# Patient Record
Sex: Female | Born: 1940 | Race: White | Hispanic: No | Smoking: Never smoker
Health system: Southern US, Community
[De-identification: ages and names within clinical notes are randomized; demographics above are authoritative.]

## PROBLEM LIST (undated history)

## (undated) DIAGNOSIS — K219 Gastro-esophageal reflux disease without esophagitis: Secondary | ICD-10-CM

## (undated) DIAGNOSIS — I10 Essential (primary) hypertension: Secondary | ICD-10-CM

## (undated) DIAGNOSIS — R600 Localized edema: Secondary | ICD-10-CM

## (undated) DIAGNOSIS — E785 Hyperlipidemia, unspecified: Secondary | ICD-10-CM

## (undated) DIAGNOSIS — I739 Peripheral vascular disease, unspecified: Secondary | ICD-10-CM

## (undated) DIAGNOSIS — M1990 Unspecified osteoarthritis, unspecified site: Secondary | ICD-10-CM

## (undated) DIAGNOSIS — K5790 Diverticulosis of intestine, part unspecified, without perforation or abscess without bleeding: Secondary | ICD-10-CM

## (undated) DIAGNOSIS — E119 Type 2 diabetes mellitus without complications: Secondary | ICD-10-CM

## (undated) DIAGNOSIS — R011 Cardiac murmur, unspecified: Secondary | ICD-10-CM

## (undated) HISTORY — PX: DILATION AND CURETTAGE OF UTERUS: SHX78

## (undated) HISTORY — DX: Gastro-esophageal reflux disease without esophagitis: K21.9

## (undated) HISTORY — PX: COLON SURGERY: SHX602

## (undated) HISTORY — DX: Unspecified osteoarthritis, unspecified site: M19.90

## (undated) HISTORY — DX: Cardiac murmur, unspecified: R01.1

## (undated) HISTORY — PX: BREAST BIOPSY: SHX20

## (undated) HISTORY — PX: TONSILLECTOMY: SUR1361

---

## 2002-12-18 ENCOUNTER — Other Ambulatory Visit: Payer: Self-pay

## 2004-07-14 ENCOUNTER — Ambulatory Visit: Payer: Self-pay | Admitting: Family Medicine

## 2005-04-22 ENCOUNTER — Ambulatory Visit: Payer: Self-pay

## 2005-04-27 ENCOUNTER — Ambulatory Visit: Payer: Self-pay

## 2005-05-24 ENCOUNTER — Emergency Department: Payer: Self-pay | Admitting: Emergency Medicine

## 2005-10-05 ENCOUNTER — Ambulatory Visit: Payer: Self-pay

## 2006-01-26 HISTORY — PX: CHOLECYSTECTOMY: SHX55

## 2006-03-25 ENCOUNTER — Ambulatory Visit: Payer: Self-pay | Admitting: Internal Medicine

## 2006-03-26 ENCOUNTER — Ambulatory Visit: Payer: Self-pay | Admitting: Internal Medicine

## 2006-04-01 ENCOUNTER — Ambulatory Visit: Payer: Self-pay | Admitting: General Surgery

## 2006-04-01 ENCOUNTER — Other Ambulatory Visit: Payer: Self-pay

## 2006-04-08 ENCOUNTER — Ambulatory Visit: Payer: Self-pay | Admitting: General Surgery

## 2006-04-14 ENCOUNTER — Ambulatory Visit: Payer: Self-pay

## 2006-05-04 ENCOUNTER — Ambulatory Visit: Payer: Self-pay | Admitting: Unknown Physician Specialty

## 2007-06-07 ENCOUNTER — Ambulatory Visit: Payer: Self-pay | Admitting: Unknown Physician Specialty

## 2007-12-09 ENCOUNTER — Ambulatory Visit: Payer: Self-pay | Admitting: Internal Medicine

## 2008-03-19 ENCOUNTER — Ambulatory Visit: Payer: Self-pay | Admitting: Internal Medicine

## 2008-05-31 ENCOUNTER — Ambulatory Visit: Payer: Self-pay | Admitting: Gastroenterology

## 2008-06-14 ENCOUNTER — Ambulatory Visit: Payer: Self-pay | Admitting: Internal Medicine

## 2008-07-26 HISTORY — PX: COLONOSCOPY W/ POLYPECTOMY: SHX1380

## 2008-08-09 ENCOUNTER — Ambulatory Visit: Payer: Self-pay | Admitting: Surgery

## 2008-08-16 ENCOUNTER — Ambulatory Visit: Payer: Self-pay | Admitting: Surgery

## 2009-06-14 ENCOUNTER — Ambulatory Visit: Payer: Self-pay | Admitting: Gastroenterology

## 2009-07-19 ENCOUNTER — Ambulatory Visit: Payer: Self-pay | Admitting: Internal Medicine

## 2010-01-26 HISTORY — PX: EYE SURGERY: SHX253

## 2010-07-11 ENCOUNTER — Ambulatory Visit: Payer: Self-pay | Admitting: Obstetrics and Gynecology

## 2010-08-04 ENCOUNTER — Ambulatory Visit: Payer: Self-pay | Admitting: Obstetrics and Gynecology

## 2010-08-07 ENCOUNTER — Ambulatory Visit: Payer: Self-pay | Admitting: Obstetrics and Gynecology

## 2010-08-12 LAB — PATHOLOGY REPORT

## 2010-08-19 ENCOUNTER — Ambulatory Visit: Payer: Self-pay | Admitting: Internal Medicine

## 2011-03-27 ENCOUNTER — Ambulatory Visit: Payer: Self-pay | Admitting: Physical Medicine and Rehabilitation

## 2011-08-21 ENCOUNTER — Ambulatory Visit: Payer: Self-pay | Admitting: Internal Medicine

## 2011-09-04 ENCOUNTER — Ambulatory Visit: Payer: Self-pay | Admitting: Gastroenterology

## 2012-08-23 ENCOUNTER — Ambulatory Visit: Payer: Self-pay | Admitting: Internal Medicine

## 2012-08-24 ENCOUNTER — Ambulatory Visit: Payer: Self-pay | Admitting: Internal Medicine

## 2012-12-27 ENCOUNTER — Ambulatory Visit: Payer: Self-pay | Admitting: Internal Medicine

## 2013-03-02 ENCOUNTER — Ambulatory Visit: Payer: Self-pay | Admitting: Internal Medicine

## 2013-07-08 DIAGNOSIS — K219 Gastro-esophageal reflux disease without esophagitis: Secondary | ICD-10-CM | POA: Insufficient documentation

## 2013-07-08 DIAGNOSIS — M159 Polyosteoarthritis, unspecified: Secondary | ICD-10-CM | POA: Insufficient documentation

## 2013-07-08 DIAGNOSIS — E1149 Type 2 diabetes mellitus with other diabetic neurological complication: Secondary | ICD-10-CM | POA: Insufficient documentation

## 2013-07-08 DIAGNOSIS — E785 Hyperlipidemia, unspecified: Secondary | ICD-10-CM | POA: Insufficient documentation

## 2013-07-08 DIAGNOSIS — K635 Polyp of colon: Secondary | ICD-10-CM | POA: Insufficient documentation

## 2013-07-08 DIAGNOSIS — I10 Essential (primary) hypertension: Secondary | ICD-10-CM | POA: Insufficient documentation

## 2013-08-21 ENCOUNTER — Ambulatory Visit: Payer: Self-pay | Admitting: Internal Medicine

## 2014-02-12 DIAGNOSIS — E1165 Type 2 diabetes mellitus with hyperglycemia: Secondary | ICD-10-CM | POA: Diagnosis not present

## 2014-03-23 DIAGNOSIS — E1149 Type 2 diabetes mellitus with other diabetic neurological complication: Secondary | ICD-10-CM | POA: Diagnosis not present

## 2014-03-23 DIAGNOSIS — E780 Pure hypercholesterolemia: Secondary | ICD-10-CM | POA: Diagnosis not present

## 2014-03-30 DIAGNOSIS — R1030 Lower abdominal pain, unspecified: Secondary | ICD-10-CM | POA: Diagnosis not present

## 2014-03-30 DIAGNOSIS — E1149 Type 2 diabetes mellitus with other diabetic neurological complication: Secondary | ICD-10-CM | POA: Diagnosis not present

## 2014-03-30 DIAGNOSIS — E780 Pure hypercholesterolemia: Secondary | ICD-10-CM | POA: Diagnosis not present

## 2014-03-30 DIAGNOSIS — I10 Essential (primary) hypertension: Secondary | ICD-10-CM | POA: Diagnosis not present

## 2014-03-30 DIAGNOSIS — R1031 Right lower quadrant pain: Secondary | ICD-10-CM | POA: Diagnosis not present

## 2014-04-19 DIAGNOSIS — M7061 Trochanteric bursitis, right hip: Secondary | ICD-10-CM | POA: Diagnosis not present

## 2014-04-19 DIAGNOSIS — M4806 Spinal stenosis, lumbar region: Secondary | ICD-10-CM | POA: Diagnosis not present

## 2014-04-19 DIAGNOSIS — M5416 Radiculopathy, lumbar region: Secondary | ICD-10-CM | POA: Diagnosis not present

## 2014-04-19 DIAGNOSIS — M7062 Trochanteric bursitis, left hip: Secondary | ICD-10-CM | POA: Diagnosis not present

## 2014-04-19 DIAGNOSIS — M7060 Trochanteric bursitis, unspecified hip: Secondary | ICD-10-CM | POA: Insufficient documentation

## 2014-04-19 DIAGNOSIS — M48061 Spinal stenosis, lumbar region without neurogenic claudication: Secondary | ICD-10-CM | POA: Insufficient documentation

## 2014-05-14 DIAGNOSIS — E1165 Type 2 diabetes mellitus with hyperglycemia: Secondary | ICD-10-CM | POA: Diagnosis not present

## 2014-05-21 DIAGNOSIS — M7062 Trochanteric bursitis, left hip: Secondary | ICD-10-CM | POA: Diagnosis not present

## 2014-05-21 DIAGNOSIS — M5416 Radiculopathy, lumbar region: Secondary | ICD-10-CM | POA: Diagnosis not present

## 2014-05-21 DIAGNOSIS — M4806 Spinal stenosis, lumbar region: Secondary | ICD-10-CM | POA: Diagnosis not present

## 2014-05-21 DIAGNOSIS — M7061 Trochanteric bursitis, right hip: Secondary | ICD-10-CM | POA: Diagnosis not present

## 2014-06-06 DIAGNOSIS — M5136 Other intervertebral disc degeneration, lumbar region: Secondary | ICD-10-CM | POA: Diagnosis not present

## 2014-06-06 DIAGNOSIS — M5416 Radiculopathy, lumbar region: Secondary | ICD-10-CM | POA: Diagnosis not present

## 2014-07-04 DIAGNOSIS — M4806 Spinal stenosis, lumbar region: Secondary | ICD-10-CM | POA: Diagnosis not present

## 2014-07-04 DIAGNOSIS — M5416 Radiculopathy, lumbar region: Secondary | ICD-10-CM | POA: Diagnosis not present

## 2014-07-26 DIAGNOSIS — E1149 Type 2 diabetes mellitus with other diabetic neurological complication: Secondary | ICD-10-CM | POA: Diagnosis not present

## 2014-07-26 DIAGNOSIS — I10 Essential (primary) hypertension: Secondary | ICD-10-CM | POA: Diagnosis not present

## 2014-07-26 DIAGNOSIS — E780 Pure hypercholesterolemia: Secondary | ICD-10-CM | POA: Diagnosis not present

## 2014-08-03 DIAGNOSIS — I10 Essential (primary) hypertension: Secondary | ICD-10-CM | POA: Diagnosis not present

## 2014-08-03 DIAGNOSIS — E780 Pure hypercholesterolemia: Secondary | ICD-10-CM | POA: Diagnosis not present

## 2014-08-03 DIAGNOSIS — E1149 Type 2 diabetes mellitus with other diabetic neurological complication: Secondary | ICD-10-CM | POA: Diagnosis not present

## 2014-08-09 DIAGNOSIS — M5416 Radiculopathy, lumbar region: Secondary | ICD-10-CM | POA: Diagnosis not present

## 2014-08-09 DIAGNOSIS — M5136 Other intervertebral disc degeneration, lumbar region: Secondary | ICD-10-CM | POA: Diagnosis not present

## 2014-08-13 ENCOUNTER — Other Ambulatory Visit: Payer: Self-pay | Admitting: Physical Medicine and Rehabilitation

## 2014-08-13 DIAGNOSIS — M79605 Pain in left leg: Secondary | ICD-10-CM

## 2014-08-13 DIAGNOSIS — M545 Low back pain: Secondary | ICD-10-CM

## 2014-08-13 DIAGNOSIS — M79604 Pain in right leg: Secondary | ICD-10-CM

## 2014-08-24 ENCOUNTER — Ambulatory Visit
Admission: RE | Admit: 2014-08-24 | Discharge: 2014-08-24 | Disposition: A | Discharge status: Home / Self Care | Payer: Commercial Managed Care - HMO | Source: Ambulatory Visit | Attending: Physical Medicine and Rehabilitation | Admitting: Physical Medicine and Rehabilitation

## 2014-08-24 DIAGNOSIS — M4317 Spondylolisthesis, lumbosacral region: Secondary | ICD-10-CM | POA: Diagnosis not present

## 2014-08-24 DIAGNOSIS — M545 Low back pain: Secondary | ICD-10-CM

## 2014-08-24 DIAGNOSIS — M47817 Spondylosis without myelopathy or radiculopathy, lumbosacral region: Secondary | ICD-10-CM | POA: Diagnosis not present

## 2014-08-24 DIAGNOSIS — M5126 Other intervertebral disc displacement, lumbar region: Secondary | ICD-10-CM | POA: Diagnosis not present

## 2014-08-24 DIAGNOSIS — M4806 Spinal stenosis, lumbar region: Secondary | ICD-10-CM | POA: Diagnosis not present

## 2014-08-24 DIAGNOSIS — M79604 Pain in right leg: Secondary | ICD-10-CM

## 2014-08-24 DIAGNOSIS — M79605 Pain in left leg: Secondary | ICD-10-CM

## 2014-09-10 DIAGNOSIS — E1165 Type 2 diabetes mellitus with hyperglycemia: Secondary | ICD-10-CM | POA: Diagnosis not present

## 2014-10-11 DIAGNOSIS — E13319 Other specified diabetes mellitus with unspecified diabetic retinopathy without macular edema: Secondary | ICD-10-CM | POA: Diagnosis not present

## 2014-10-18 DIAGNOSIS — M5136 Other intervertebral disc degeneration, lumbar region: Secondary | ICD-10-CM | POA: Diagnosis not present

## 2014-10-18 DIAGNOSIS — M5416 Radiculopathy, lumbar region: Secondary | ICD-10-CM | POA: Diagnosis not present

## 2014-10-18 DIAGNOSIS — M7541 Impingement syndrome of right shoulder: Secondary | ICD-10-CM | POA: Diagnosis not present

## 2014-10-22 ENCOUNTER — Other Ambulatory Visit: Payer: Self-pay | Admitting: Internal Medicine

## 2014-10-22 DIAGNOSIS — Z1231 Encounter for screening mammogram for malignant neoplasm of breast: Secondary | ICD-10-CM

## 2014-10-25 ENCOUNTER — Other Ambulatory Visit: Payer: Self-pay | Admitting: Internal Medicine

## 2014-10-25 ENCOUNTER — Ambulatory Visit
Admission: RE | Admit: 2014-10-25 | Discharge: 2014-10-25 | Disposition: A | Discharge status: Home / Self Care | Payer: Commercial Managed Care - HMO | Source: Ambulatory Visit | Attending: Internal Medicine | Admitting: Internal Medicine

## 2014-10-25 DIAGNOSIS — Z1231 Encounter for screening mammogram for malignant neoplasm of breast: Secondary | ICD-10-CM

## 2014-11-13 DIAGNOSIS — Z86010 Personal history of colonic polyps: Secondary | ICD-10-CM | POA: Diagnosis not present

## 2014-12-07 DIAGNOSIS — E1149 Type 2 diabetes mellitus with other diabetic neurological complication: Secondary | ICD-10-CM | POA: Diagnosis not present

## 2014-12-07 DIAGNOSIS — E7800 Pure hypercholesterolemia, unspecified: Secondary | ICD-10-CM | POA: Diagnosis not present

## 2014-12-14 DIAGNOSIS — E1140 Type 2 diabetes mellitus with diabetic neuropathy, unspecified: Secondary | ICD-10-CM | POA: Diagnosis not present

## 2014-12-14 DIAGNOSIS — Z23 Encounter for immunization: Secondary | ICD-10-CM | POA: Diagnosis not present

## 2014-12-14 DIAGNOSIS — Z0000 Encounter for general adult medical examination without abnormal findings: Secondary | ICD-10-CM | POA: Diagnosis not present

## 2014-12-14 DIAGNOSIS — I10 Essential (primary) hypertension: Secondary | ICD-10-CM | POA: Diagnosis not present

## 2014-12-14 DIAGNOSIS — E7800 Pure hypercholesterolemia, unspecified: Secondary | ICD-10-CM | POA: Diagnosis not present

## 2014-12-24 ENCOUNTER — Encounter: Payer: Self-pay | Admitting: *Deleted

## 2014-12-25 ENCOUNTER — Ambulatory Visit
Admission: RE | Admit: 2014-12-25 | Discharge: 2014-12-25 | Disposition: A | Discharge status: Home / Self Care | Payer: Commercial Managed Care - HMO | Source: Ambulatory Visit | Attending: Gastroenterology | Admitting: Gastroenterology

## 2014-12-25 ENCOUNTER — Ambulatory Visit: Payer: Commercial Managed Care - HMO | Admitting: Anesthesiology

## 2014-12-25 ENCOUNTER — Encounter
Admission: RE | Disposition: A | Discharge status: Home / Self Care | Payer: Self-pay | Source: Ambulatory Visit | Attending: Gastroenterology

## 2014-12-25 ENCOUNTER — Encounter: Payer: Self-pay | Admitting: *Deleted

## 2014-12-25 DIAGNOSIS — Z7984 Long term (current) use of oral hypoglycemic drugs: Secondary | ICD-10-CM | POA: Diagnosis not present

## 2014-12-25 DIAGNOSIS — Z79899 Other long term (current) drug therapy: Secondary | ICD-10-CM | POA: Insufficient documentation

## 2014-12-25 DIAGNOSIS — K635 Polyp of colon: Secondary | ICD-10-CM | POA: Diagnosis not present

## 2014-12-25 DIAGNOSIS — K5790 Diverticulosis of intestine, part unspecified, without perforation or abscess without bleeding: Secondary | ICD-10-CM | POA: Diagnosis not present

## 2014-12-25 DIAGNOSIS — K621 Rectal polyp: Secondary | ICD-10-CM | POA: Insufficient documentation

## 2014-12-25 DIAGNOSIS — K5730 Diverticulosis of large intestine without perforation or abscess without bleeding: Secondary | ICD-10-CM | POA: Diagnosis not present

## 2014-12-25 DIAGNOSIS — E785 Hyperlipidemia, unspecified: Secondary | ICD-10-CM | POA: Diagnosis not present

## 2014-12-25 DIAGNOSIS — E119 Type 2 diabetes mellitus without complications: Secondary | ICD-10-CM | POA: Insufficient documentation

## 2014-12-25 DIAGNOSIS — D120 Benign neoplasm of cecum: Secondary | ICD-10-CM | POA: Diagnosis not present

## 2014-12-25 DIAGNOSIS — Z7982 Long term (current) use of aspirin: Secondary | ICD-10-CM | POA: Insufficient documentation

## 2014-12-25 DIAGNOSIS — I10 Essential (primary) hypertension: Secondary | ICD-10-CM | POA: Diagnosis not present

## 2014-12-25 DIAGNOSIS — Z86010 Personal history of colonic polyps: Secondary | ICD-10-CM | POA: Diagnosis not present

## 2014-12-25 HISTORY — PX: COLONOSCOPY WITH PROPOFOL: SHX5780

## 2014-12-25 HISTORY — DX: Type 2 diabetes mellitus without complications: E11.9

## 2014-12-25 HISTORY — DX: Hyperlipidemia, unspecified: E78.5

## 2014-12-25 HISTORY — DX: Essential (primary) hypertension: I10

## 2014-12-25 LAB — GLUCOSE, CAPILLARY: GLUCOSE-CAPILLARY: 145 mg/dL — AB (ref 65–99)

## 2014-12-25 SURGERY — COLONOSCOPY WITH PROPOFOL
Anesthesia: General

## 2014-12-25 MED ORDER — SODIUM CHLORIDE 0.9 % IV SOLN
INTRAVENOUS | Status: DC
  Administered 2014-12-25: 09:00:00 via INTRAVENOUS

## 2014-12-25 MED ORDER — FENTANYL CITRATE (PF) 100 MCG/2ML IJ SOLN
INTRAMUSCULAR | Status: DC | PRN
  Administered 2014-12-25: 50 ug via INTRAVENOUS

## 2014-12-25 MED ORDER — SPOT INK MARKER SYRINGE KIT
PACK | SUBMUCOSAL | Status: DC | PRN
  Administered 2014-12-25: 3 mL via SUBMUCOSAL

## 2014-12-25 MED ORDER — MIDAZOLAM HCL 5 MG/5ML IJ SOLN
INTRAMUSCULAR | Status: DC | PRN
  Administered 2014-12-25: 1 mg via INTRAVENOUS

## 2014-12-25 MED ORDER — SODIUM CHLORIDE 0.9 % IV SOLN
INTRAVENOUS | Status: DC
Start: 2014-12-25 — End: 2014-12-25

## 2014-12-25 MED ORDER — PROPOFOL 500 MG/50ML IV EMUL
INTRAVENOUS | Status: DC | PRN
  Administered 2014-12-25: 100 ug/kg/min via INTRAVENOUS

## 2014-12-25 MED ORDER — PROPOFOL 10 MG/ML IV BOLUS
INTRAVENOUS | Status: DC | PRN
  Administered 2014-12-25: 60 mg via INTRAVENOUS

## 2014-12-25 MED ORDER — LIDOCAINE HCL (CARDIAC) 20 MG/ML IV SOLN
INTRAVENOUS | Status: DC | PRN
  Administered 2014-12-25: 67 mg via INTRAVENOUS

## 2014-12-25 NOTE — H&P (Signed)
Outpatient short stay form Pre-procedure 12/25/2014 10:39 AM Lollie Sails MD  Primary Physician: Frazier Richards M.D.  Reason for visit:  Colonoscopy  History of present illness:  Patient is a 74 year old female presenting today for a follow-up colonoscopy. She has a history of adenomatous colon polyps one of these being large enough to necessitate surgery in 2010. This was in apparently the upper rectal area. Her last colonoscopy was 09/04/2011 showed no polyps at that time. She is presenting for surveillance procedure. She denies taking any recent aspirin products she takes no blood thinners.    Current facility-administered medications:  .  0.9 %  sodium chloride infusion, , Intravenous, Continuous, Lollie Sails, MD, Last Rate: 20 mL/hr at 12/25/14 0851 .  0.9 %  sodium chloride infusion, , Intravenous, Continuous, Lollie Sails, MD  Prescriptions prior to admission  Medication Sig Dispense Refill Last Dose  . aspirin 81 MG tablet Take 81 mg by mouth daily.     Marland Kitchen aspirin-acetaminophen-caffeine (EXCEDRIN MIGRAINE) 250-250-65 MG tablet Take by mouth every 6 (six) hours as needed for headache.     Marland Kitchen atorvastatin (LIPITOR) 80 MG tablet Take 80 mg by mouth daily.     . cholecalciferol (VITAMIN D) 400 UNITS TABS tablet Take 400 Units by mouth.     . fosinopril (MONOPRIL) 10 MG tablet Take 10 mg by mouth daily.     Marland Kitchen gabapentin (NEURONTIN) 100 MG capsule Take 100 mg by mouth 3 (three) times daily.     Marland Kitchen glimepiride (AMARYL) 4 MG tablet Take 4 mg by mouth daily with breakfast.     . metFORMIN (GLUCOPHAGE) 500 MG tablet Take by mouth 2 (two) times daily with a meal.     . omeprazole (PRILOSEC) 40 MG capsule Take 40 mg by mouth daily.        Allergies  Allergen Reactions  . Codeine   . Vicodin [Hydrocodone-Acetaminophen]      Past Medical History  Diagnosis Date  . Diabetes mellitus without complication ([place])   . Hypertension   . Hyperlipidemia     Review of  systems:      Physical Exam    Heart and lungs: Regular rate and rhythm without rub or gallop. There is a 2-3 aortic position murmur. Lungs are bilaterally clear    HEENT: Normocephalic atraumatic eyes are anicteric    Other:     Pertinant exam for procedure: Soft nontender nondistended bowel sounds positive normoactive    Planned proceedures: Colonoscopy and indicated procedures. I have discussed the risks benefits and complications of procedures to include not limited to bleeding, infection, perforation and the risk of sedation and the patient wishes to proceed. 2. With patient's presentation of marked aortic murmur and review of history showing she has not had further previous evaluation in that regard we will arrange for her to have an echocardiogram to better define function and possible cardiology consultation following for procedure in the next couple weeks.    Lollie Sails, MD Gastroenterology 12/25/2014  10:39 AM

## 2014-12-25 NOTE — Op Note (Signed)
[facility] Gastroenterology Patient Name: Carly Bell Procedure Date: 12/25/2014 10:48 AM MRN: [id number] Account #: [id number] Date of Birth: 1941/01/19 Admit Type: Outpatient Age: 74 Room: [facility] ENDO ROOM 3 Gender: Female Note Status: Finalized Procedure:         Colonoscopy Indications:       Personal history of colonic polyps, Personal history of                     rectal adenoma Providers:         Lollie Sails, MD Referring MD:      Ocie Cornfield. Ouida Sills, MD (Referring MD) Medicines:         Monitored Anesthesia Care Complications:     No immediate complications. Procedure:         Pre-Anesthesia Assessment:                    - ASA Grade Assessment: III - A patient with severe                     systemic disease.                    After obtaining informed consent, the colonoscope was                     passed under direct vision. Throughout the procedure, the                     patient's blood pressure, pulse, and oxygen saturations                     were monitored continuously. The Colonoscope was                     introduced through the anus and advanced to the the cecum,                     identified by appendiceal orifice and ileocecal valve. The                     colonoscopy was unusually difficult due to a tortuous                     colon. Successful completion of the procedure was aided by                     changing the patient to a supine position, changing the                     patient to a prone position and using manual pressure. The                     quality of the bowel preparation was fair. Findings:      A 2 mm polyp was found in the rectum. The polyp was sessile. The polyp       was removed with a cold biopsy forceps. Resection and retrieval were       complete.      A large post polypectomy scar was found in the mid rectum. The scar       tissue was healthy in appearance. There was no evidence of the previous     polyp. Area marked with previously with ink noted .  A 33 mm polyp was found at the distal ileocecal valve. The polyp was       carpet-like. Biopsies were taken with a cold forceps for histology. Area       was tattooed with an injection of 3 mL of [place] ink.      Multiple small-mouthed diverticula were found in the sigmoid colon and       in the distal descending colon.      The digital rectal exam was normal. Impression:        - One 2 mm polyp in the rectum. Resected and retrieved.                    - Post-polypectomy scar in the mid rectum.                    - One 33 mm polyp at the ileocecal valve. Biopsied.                     Tattooed.                    - Diverticulosis in the sigmoid colon and in the distal                     descending colon. Recommendation:    - Refer to a colo-rectal surgeon at appointment to be                     scheduled.                    - Await pathology results. Procedure Code(s): --- Professional ---                    [id number], Colonoscopy, flexible; with biopsy, single or                     multiple                    45381, Colonoscopy, flexible; with directed submucosal                     injection(s), any substance Diagnosis Code(s): --- Professional ---                    K62.1, Rectal polyp                    D12.0, Benign neoplasm of cecum                    Z98.89, Other specified postprocedural states                    Z86.010, Personal history of colonic polyps                    Z86.018, Personal history of other benign neoplasm                    K57.30, Diverticulosis of large intestine without                     perforation or abscess without bleeding CPT copyright 2014 American Medical Association. All rights reserved. The codes documented in this report are preliminary and upon coder review may  be revised to meet current compliance requirements. Lollie Sails, MD 12/25/2014  11:27:30 AM This report has been signed  electronically. Number of Addenda: 0 Note Initiated On: 12/25/2014 10:48 AM Scope Withdrawal Time: 0 hours 13 minutes 47 seconds  Total Procedure Duration: 0 hours 24 minutes 58 seconds       [facility]

## 2014-12-25 NOTE — Anesthesia Preprocedure Evaluation (Addendum)
Anesthesia Evaluation  Patient identified by MRN, date of birth, ID band Patient awake    Reviewed: Allergy & Precautions, H&P , NPO status , Patient's Chart, lab work & pertinent test results, reviewed documented beta blocker date and time   Airway Mallampati: II  TM Distance: >3 FB Neck ROM: full    Dental no notable dental hx.    Pulmonary neg pulmonary ROS,    Pulmonary exam normal breath sounds clear to auscultation       Cardiovascular Exercise Tolerance: Poor hypertension, negative cardio ROS   Rhythm:regular Rate:Normal  Limited exercise tolerance but can walk a flight of stairs and no orthopnea or pnd.  Pos 2 or 3 midsystolic ejection murmur present. No anginal equivalents. Plan for post procedure workup with echo but do not feel this is necessary for intraop mgmt for our procedure today.   Neuro/Psych negative neurological ROS  negative psych ROS   GI/Hepatic negative GI ROS, Neg liver ROS,   Endo/Other  negative endocrine ROSdiabetes  Renal/GU negative Renal ROS  negative genitourinary   Musculoskeletal   Abdominal   Peds  Hematology negative hematology ROS (+)   Anesthesia Other Findings   Reproductive/Obstetrics negative OB ROS                          Anesthesia Physical Anesthesia Plan  ASA: III  Anesthesia Plan: General   Post-op Pain Management:    Induction:   Airway Management Planned:   Additional Equipment:   Intra-op Plan:   Post-operative Plan:   Informed Consent: I have reviewed the patients History and Physical, chart, labs and discussed the procedure including the risks, benefits and alternatives for the proposed anesthesia with the patient or authorized representative who has indicated his/her understanding and acceptance.   Dental Advisory Given  Plan Discussed with: CRNA  Anesthesia Plan Comments:        Anesthesia Quick Evaluation

## 2014-12-25 NOTE — Transfer of Care (Signed)
Immediate Anesthesia Transfer of Care Note  Patient: Carly Bell  Procedure(s) Performed: Procedure(s): COLONOSCOPY WITH PROPOFOL (N/A)  Patient Location: PACU  Anesthesia Type:General  Level of Consciousness: awake and patient cooperative  Airway & Oxygen Therapy: Patient Spontanous Breathing and Patient connected to nasal cannula oxygen  Post-op Assessment: Report given to RN and Post -op Vital signs reviewed and stable  Post vital signs: Reviewed and stable  Last Vitals:  Filed Vitals:   12/25/14 0840 12/25/14 1128  BP: 153/59 129/65  Pulse: 77 82  Temp: 36.7 C   Resp: 20 19    Complications: No apparent anesthesia complications

## 2014-12-26 ENCOUNTER — Encounter: Payer: Self-pay | Admitting: Gastroenterology

## 2014-12-26 LAB — SURGICAL PATHOLOGY

## 2014-12-26 NOTE — Anesthesia Postprocedure Evaluation (Signed)
Anesthesia Post Note  Patient: Carly Bell  Procedure(s) Performed: Procedure(s) (LRB): COLONOSCOPY WITH PROPOFOL (N/A)  Patient location during evaluation: PACU Anesthesia Type: General Level of consciousness: awake and alert Pain management: pain level controlled Vital Signs Assessment: post-procedure vital signs reviewed and stable Respiratory status: spontaneous breathing, nonlabored ventilation, respiratory function stable and patient connected to nasal cannula oxygen Cardiovascular status: blood pressure returned to baseline and stable Postop Assessment: no signs of nausea or vomiting Anesthetic complications: no    Last Vitals:  Filed Vitals:   12/25/14 1200 12/25/14 1210  BP: 146/74 137/63  Pulse: 72 70  Temp:    Resp: 20 17    Last Pain:  Filed Vitals:   12/26/14 0833  PainSc: 0-No pain                 Molli Barrows

## 2015-01-04 DIAGNOSIS — I358 Other nonrheumatic aortic valve disorders: Secondary | ICD-10-CM | POA: Diagnosis not present

## 2015-02-07 DIAGNOSIS — D369 Benign neoplasm, unspecified site: Secondary | ICD-10-CM | POA: Diagnosis not present

## 2015-03-06 DIAGNOSIS — I350 Nonrheumatic aortic (valve) stenosis: Secondary | ICD-10-CM | POA: Diagnosis not present

## 2015-03-06 DIAGNOSIS — D122 Benign neoplasm of ascending colon: Secondary | ICD-10-CM | POA: Diagnosis not present

## 2015-03-07 DIAGNOSIS — K219 Gastro-esophageal reflux disease without esophagitis: Secondary | ICD-10-CM | POA: Diagnosis not present

## 2015-03-07 DIAGNOSIS — Z01810 Encounter for preprocedural cardiovascular examination: Secondary | ICD-10-CM | POA: Diagnosis not present

## 2015-03-07 DIAGNOSIS — G629 Polyneuropathy, unspecified: Secondary | ICD-10-CM | POA: Diagnosis not present

## 2015-03-07 DIAGNOSIS — E119 Type 2 diabetes mellitus without complications: Secondary | ICD-10-CM | POA: Diagnosis not present

## 2015-03-07 DIAGNOSIS — E784 Other hyperlipidemia: Secondary | ICD-10-CM | POA: Diagnosis not present

## 2015-03-07 DIAGNOSIS — I10 Essential (primary) hypertension: Secondary | ICD-10-CM | POA: Diagnosis not present

## 2015-03-07 DIAGNOSIS — I358 Other nonrheumatic aortic valve disorders: Secondary | ICD-10-CM | POA: Diagnosis not present

## 2015-03-07 DIAGNOSIS — E669 Obesity, unspecified: Secondary | ICD-10-CM | POA: Diagnosis not present

## 2015-03-12 DIAGNOSIS — Z01818 Encounter for other preprocedural examination: Secondary | ICD-10-CM | POA: Diagnosis not present

## 2015-04-02 ENCOUNTER — Telehealth: Payer: Self-pay

## 2015-04-02 ENCOUNTER — Ambulatory Visit (INDEPENDENT_AMBULATORY_CARE_PROVIDER_SITE_OTHER): Payer: PPO | Admitting: Surgery

## 2015-04-02 ENCOUNTER — Encounter
Admission: RE | Admit: 2015-04-02 | Discharge: 2015-04-02 | Disposition: A | Discharge status: Home / Self Care | Payer: PPO | Source: Ambulatory Visit | Attending: Surgery | Admitting: Surgery

## 2015-04-02 ENCOUNTER — Encounter: Payer: Self-pay | Admitting: Surgery

## 2015-04-02 VITALS — BP 154/80 | HR 68 | Temp 98.6°F | Ht 66.0 in | Wt 275.0 lb

## 2015-04-02 DIAGNOSIS — Z01812 Encounter for preprocedural laboratory examination: Secondary | ICD-10-CM | POA: Insufficient documentation

## 2015-04-02 DIAGNOSIS — Z01810 Encounter for preprocedural cardiovascular examination: Secondary | ICD-10-CM | POA: Insufficient documentation

## 2015-04-02 DIAGNOSIS — K635 Polyp of colon: Secondary | ICD-10-CM | POA: Diagnosis not present

## 2015-04-02 HISTORY — DX: Localized edema: R60.0

## 2015-04-02 HISTORY — DX: Peripheral vascular disease, unspecified: I73.9

## 2015-04-02 LAB — CBC WITH DIFFERENTIAL/PLATELET
Basophils Absolute: 0.1 10*3/uL (ref 0–0.1)
Basophils Relative: 1 %
Eosinophils Absolute: 0.2 10*3/uL (ref 0–0.7)
Eosinophils Relative: 2 %
HEMATOCRIT: 36.7 % (ref 35.0–47.0)
HEMOGLOBIN: 12.2 g/dL (ref 12.0–16.0)
LYMPHS ABS: 2.9 10*3/uL (ref 1.0–3.6)
LYMPHS PCT: 31 %
MCH: 26.2 pg (ref 26.0–34.0)
MCHC: 33.2 g/dL (ref 32.0–36.0)
MCV: 78.8 fL — ABNORMAL LOW (ref 80.0–100.0)
MONOS PCT: 8 %
Monocytes Absolute: 0.7 10*3/uL (ref 0.2–0.9)
NEUTROS ABS: 5.5 10*3/uL (ref 1.4–6.5)
NEUTROS PCT: 58 %
Platelets: 190 10*3/uL (ref 150–440)
RBC: 4.65 MIL/uL (ref 3.80–5.20)
RDW: 15.1 % — ABNORMAL HIGH (ref 11.5–14.5)
WBC: 9.4 10*3/uL (ref 3.6–11.0)

## 2015-04-02 LAB — SURGICAL PCR SCREEN
MRSA, PCR: NEGATIVE
STAPHYLOCOCCUS AUREUS: NEGATIVE

## 2015-04-02 LAB — COMPREHENSIVE METABOLIC PANEL
ALBUMIN: 3.9 g/dL (ref 3.5–5.0)
ALK PHOS: 83 U/L (ref 38–126)
ALT: 18 U/L (ref 14–54)
ANION GAP: 8 (ref 5–15)
AST: 19 U/L (ref 15–41)
BILIRUBIN TOTAL: 0.6 mg/dL (ref 0.3–1.2)
BUN: 16 mg/dL (ref 6–20)
CALCIUM: 8.9 mg/dL (ref 8.9–10.3)
CO2: 28 mmol/L (ref 22–32)
Chloride: 106 mmol/L (ref 101–111)
Creatinine, Ser: 0.58 mg/dL (ref 0.44–1.00)
GFR calc non Af Amer: >60 mL/min (ref >60)
GLUCOSE: 120 mg/dL — AB (ref 65–99)
Potassium: 3.7 mmol/L (ref 3.5–5.1)
Sodium: 142 mmol/L (ref 135–145)
TOTAL PROTEIN: 7 g/dL (ref 6.5–8.1)

## 2015-04-02 LAB — PROTIME-INR
INR: 1.04
Prothrombin Time: 13.8 seconds (ref 11.4–15.0)

## 2015-04-02 LAB — APTT: aPTT: 32 seconds (ref 24–36)

## 2015-04-02 NOTE — Patient Instructions (Addendum)
We will keep your surgery date and time as scheduled on 04/11/15. It has been scheduled now with Dr. Azalee Course.  You will need to stop taking your aspirin and Excedrin migraine at least 5 days prior to scheduled surgery.  Please pick-up your antibiotics and bowel prep medications sent in by Dr. Thompson Caul nurse (we use the exact same medications). See our Bowel prep instructions that you have been given today for further information.  Please see the blue (pre-care) form for information regarding your scheduled surgery.

## 2015-04-02 NOTE — Pre-Procedure Instructions (Signed)
Result Impression   Normal myocardial perfusion scan no evidence of stress-induced  myocardial ischemia ejection fraction 65% conclusion negative scan   Result Narrative  Procedure: Pharmacologic Myocardial Perfusion Imaging   ONE day procedure  Indication: Pre-operative examination - Plan: NM myocardial perfusion  SPECT multiple (stress and rest), ECG stress test only Ordering Physician:   Dr. Lujean Amel   Clinical History: 75 y.o. year old female preoperative examination multiple risk factors Vitals: Height: 66 in Weight: 276 lb Cardiac risk factors include:    Hyperlipidemia, Diabetes, HTN and Obesity    Procedure:  Pharmacologic stress testing was performed with Regadenoson using a single  use 0.4mg /1ml (0.08 mg/ml) prefilled syringe intravenously infused as a  bolus dose. The stress test was stopped due to Infusion completion.  Blood  pressure response was normal.    Rest HR: 62bpm Rest BP: 138/10mmHg Max HR: 93bpm Min BP: 144/8mmHg  Stress Test Administered by: Oswald Hillock, CMA  ECG Interpretation: Rest ECG:  normal sinus rhythm, none Stress ECG:  normal sinus rhythm,  Recovery ECG:  normal sinus rhythm ECG Interpretation:  non-diagnostic due to pharmacologic testing.   Administrations This Visit    regadenoson (LEXISCAN) 0.4 mg/5 mL inj syringe 0.4 mg    Admin Date Action Dose Route Administered By      03/12/2015 Given 0.4 mg Intravenous Kingsley Callander, CNMT            technetium Tc81m sestamibi (CARDIOLITE) injection 123456 millicurie    Admin Date Action Dose Route Administered By      [id number] Given 123456 millicurie Intravenous Kingsley Callander, CNMT            technetium Tc21m sestamibi (CARDIOLITE) injection 0000000 millicurie    Admin Date Action Dose Route Administered By      [id number] Given 0000000 millicurie Intravenous Kingsley Callander, CNMT              Gated post-stress perfusion imaging was performed 30 minutes after  stress.  Rest images were performed 30 minutes after injection.  Gated LV Analysis:  TID Ratio: 1.18  LVEF= 65 %  FINDINGS: Regional wall motion:  reveals normal myocardial thickening and wall  motion. The overall quality of the study is good.   Artifacts noted: no Left ventricular cavity: normal.  Perfusion Analysis:  SPECT images demonstrate homogeneous tracer  distribution throughout the myocardium.     Status Results Details   Encounter Summary  ECG stress test only2/14/2017  [place]  ECG stress test only2/14/2017  [place]  Component Name Value Range       Status Results Details   Encounter Summary  2016 December

## 2015-04-02 NOTE — Patient Instructions (Signed)
  Your procedure is scheduled on: 04/11/15 Thurs Report to Day Surgery.2nd floor medical mall To find out your arrival time please call [phone] between 1PM - 3PM on 04/10/15 Wed.  Remember: Instructions that are not followed completely may result in serious medical risk, up to and including death, or upon the discretion of your surgeon and anesthesiologist your surgery may need to be rescheduled.    __x__ 1. Do not eat food or drink liquids after midnight. No gum chewing or hard candies.     ____ 2. No Alcohol for 24 hours before or after surgery.   ____ 3. Bring all medications with you on the day of surgery if instructed.    _x___ 4. Notify your doctor if there is any change in your medical condition     (cold, fever, infections).     Do not wear jewelry, make-up, hairpins, clips or nail polish.  Do not wear lotions, powders, or perfumes. You may wear deodorant.  Do not shave 48 hours prior to surgery. Men may shave face and neck.  Do not bring valuables to the hospital.    [facility] is not responsible for any belongings or valuables.               Contacts, dentures or bridgework may not be worn into surgery.  Leave your suitcase in the car. After surgery it may be brought to your room.  For patients admitted to the hospital, discharge time is determined by your                treatment team.   Patients discharged the day of surgery will not be allowed to drive home.   Please read over the following fact sheets that you were given:   MRSA Information   ____ Take these medicines the morning of surgery with A SIP OF WATER:    1. fosinopril (MONOPRIL) 10 MG tablet  2. gabapentin (NEURONTIN) 100 MG capsule  3. omeprazole (PRILOSEC) 40 MG capsule  4.  5.  6.  ____ Fleet Enema (as directed)   _x___ Use CHG Soap as directed  ____ Use inhalers on the day of surgery  ___x_ Stop metformin 2 days prior to surgery    ____ Take 1/2 of usual insulin dose the night before  surgery and none on the morning of surgery.   _x___ Stop Coumadin/Plavix/aspirin on stop aspirin 1 week before surgery may take Tylenol as needed  ____ Stop Anti-inflammatories on    ____ Stop supplements until after surgery.    ____ Bring C-Pap to the hospital.

## 2015-04-02 NOTE — Progress Notes (Signed)
Subjective:     Patient ID: Carly Bell, female   DOB: Aug 28, 1940, 75 y.o.   MRN: [id number]  HPI  75 yr old female with large 47mm polyp at ileocecal valve. Patient originally evaluated for Lap colectomy with Dr. Tamala Julian, but he was unable to do her operation.  She is here today to meet with me.  She is not having any nausea, vomiting, abdominal pain, diarrhea, constipation, melena or dysuria.   Past Medical History  Diagnosis Date  . Diabetes mellitus without complication ([place])   . Hypertension   . Hyperlipidemia   . Cardiac murmur Diagnosed in 2007  . Arthritis   . GERD (gastroesophageal reflux disease)    Past Surgical History  Procedure Laterality Date  . Tonsillectomy  as child  . Dilation and curettage of uterus  Unsure of Date  . Colonoscopy with propofol N/A 12/25/2014    Procedure: COLONOSCOPY WITH PROPOFOL;  Surgeon: Lollie Sails, MD;  Location: [facility] ENDOSCOPY;  Service: Endoscopy;  Laterality: N/A;  . Breast biopsy Left 1990's    neg- Done in Radiology  . Cholecystectomy  2008  . Eye surgery Bilateral 2012    Cataract Removal- [facility]  . Colonoscopy w/ polypectomy  07/2008    Dr. Rochel Brome   Family History  Problem Relation Age of Onset  . Prostate cancer Father   . Alzheimer's disease Mother   . Diabetes Sister   . Diabetes Brother    Social History   Social History  . Marital Status: Widowed    Spouse Name: N/A  . Number of Children: N/A  . Years of Education: N/A   Social History Main Topics  . Smoking status: Never Smoker   . Smokeless tobacco: Never Used  . Alcohol Use: No  . Drug Use: No  . Sexual Activity: Not Asked   Other Topics Concern  . None   Social History Narrative    Current outpatient prescriptions:  .  acetaminophen (TYLENOL ARTHRITIS PAIN) 650 MG CR tablet, Take 650 mg by mouth every 8 (eight) hours as needed for pain., Disp: , Rfl:  .  aspirin 81 MG tablet, Take 81 mg by mouth daily., Disp: , Rfl:  .   aspirin-acetaminophen-caffeine (EXCEDRIN MIGRAINE) 250-250-65 MG tablet, Take by mouth every 6 (six) hours as needed for headache., Disp: , Rfl:  .  atorvastatin (LIPITOR) 80 MG tablet, Take 80 mg by mouth at bedtime. , Disp: , Rfl:  .  cholecalciferol (VITAMIN D) 400 UNITS TABS tablet, Take 400 Units by mouth daily. , Disp: , Rfl:  .  fosinopril (MONOPRIL) 10 MG tablet, Take 10 mg by mouth daily., Disp: , Rfl:  .  gabapentin (NEURONTIN) 100 MG capsule, Take 100 mg by mouth 3 (three) times daily., Disp: , Rfl:  .  glimepiride (AMARYL) 4 MG tablet, Take 4 mg by mouth daily with breakfast., Disp: , Rfl:  .  metFORMIN (GLUCOPHAGE) 500 MG tablet, Take by mouth 2 (two) times daily with a meal., Disp: , Rfl:  .  omeprazole (PRILOSEC) 40 MG capsule, Take 40 mg by mouth daily., Disp: , Rfl:  Allergies  Allergen Reactions  . Codeine   . Vicodin [Hydrocodone-Acetaminophen]     Review of Systems  Constitutional: Negative for fever, chills, activity change and appetite change.  HENT: Negative for congestion and sore throat.   Respiratory: Negative for cough, chest tightness, shortness of breath and wheezing.   Cardiovascular: Negative for chest pain, palpitations and leg swelling.  Gastrointestinal: Negative for nausea, vomiting, diarrhea, constipation and abdominal distention.  Genitourinary: Negative for dysuria, hematuria and flank pain.  Musculoskeletal: Negative for back pain and arthralgias.  Skin: Negative for color change, pallor, rash and wound.  Neurological: Negative for dizziness and tremors.  Hematological: Negative for adenopathy. Does not bruise/bleed easily.  Psychiatric/Behavioral: Negative for decreased concentration and agitation.       Objective:   Physical Exam  Constitutional: She is oriented to person, place, and time. She appears well-developed and well-nourished. No distress.  HENT:  Head: Normocephalic and atraumatic.  Right Ear: External ear normal.  Left Ear:  External ear normal.  Nose: Nose normal.  Mouth/Throat: Oropharynx is clear and moist. No oropharyngeal exudate.  Eyes: Conjunctivae and EOM are normal. Pupils are equal, round, and reactive to light. No scleral icterus.  Neck: Neck supple. No tracheal deviation present.  Cardiovascular: Normal rate, regular rhythm, normal heart sounds and intact distal pulses.  Exam reveals no gallop and no friction rub.   No murmur heard. Pulmonary/Chest: Effort normal and breath sounds normal. No respiratory distress. She has no wheezes. She has no rales.  Abdominal: Soft. Bowel sounds are normal. She exhibits no distension. There is no tenderness. There is no rebound.  Musculoskeletal: Normal range of motion. She exhibits no edema or tenderness.  Neurological: She is alert and oriented to person, place, and time. No cranial nerve deficit.  Skin: Skin is warm and dry. No rash noted. No erythema. No pallor.  Psychiatric: She has a normal mood and affect. Her behavior is normal. Judgment and thought content normal.  Vitals reviewed.      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    Plan:     I discussed the risk benefits alternatives and treatment options with the patient to include Laparoscopic resection of the colon possible open, possible ostomy placement.I discussed with them that the risk of surgery were bleeding, infection, anastomotic leak, abscess formation, need for drain placement, damage to the ureters, damage to bladder, need for prolonged catheter after surgery, possible need to convert to open procedure, possible need for ostomy placement, possible need for blood products during surgery, possible need for reoperation; as well as anesthetic risk of heart attack stroke and blood clots and death.  I also discussed with her that his length of hospital stay will be anywhere between 5-10 days depending on operation.  She is scheduled for March 16th.

## 2015-04-02 NOTE — Pre-Procedure Instructions (Signed)
EKG CALLED AND FAXED TO DR CALLWOOD TO REVIEW.SPOKE WITH BETSIE

## 2015-04-02 NOTE — Pre-Procedure Instructions (Signed)
Value Range  LV Ejection Fraction (%) 55   Aortic Valve Regurgitation Grade trivial   Aortic Valve Stenosis Grade mild   Aortic Valve Max Velocity (m/s) 2.9 m/sec   Aortic Valve Stenosis Mean Gradient (mmHg) 17.0 mmHg   Mitral Valve Stenosis Grade none   Mitral Valve Regurgitation Grade trivial   Tricuspid Valve Regurgitation Grade trivial   Tricuspid Valve Regurgitation Max Velocity (m/s) 1.5 m/sec   Right Ventricle Systolic Pressure (mmHg) 0000000 mmHg   LV End Diastolic Diameter (cm) 4.8 cm  LV End Systolic Diameter (cm) 3 cm  LV Septum Wall Thickness (cm) 1.3 cm  LV Posterior Wall Thickness (cm) 1.3 cm  Left Atrium Diameter (cm) 3.4 cm   Result Narrative                    CARDIOLOGY DEPARTMENT                       [place], [place]                          [id number]                  [facility]                     Acct #: [id number]        [address], [place], [place] [address]           Date: 01/04/2015 01:07 PM                                                               Adult Female Age: 75 yrs                   ECHOCARDIOGRAM REPORT                       Outpatient         STUDY:CHEST WALL         TAPE:0000:00: 0:00:00        KC::KCWC          ECHO:Yes   DOPPLER:Yes  FILE:[id number]            MD1:  SKULSKIE, MARTIN USHER         COLOR:Yes  CONTRAST:No       MACHINE:Philips     RV BIOPSY:No         3D:No    SOUND QLTY:Moderate        MEDIUM:None ___________________________________________________________________________________________           HISTORY:Murmur           REASON:Assess, LV function, Assess, Source of Murmur       INDICATION:I35.8 Heart murmur, aortic ___________________________________________________________________________________________  ECHOCARDIOGRAPHIC MEASUREMENTS 2D DIMENSIONS AORTA             Values      Normal Range      MAIN PA          Values      Normal Range  Annulus:  nm*        [2.1 - 2.5]                PA Main:  nm*       [1.5 - 2.1]         Aorta Sin:  nm*       [2.7 - 3.3]       RIGHT VENTRICLE       ST Junction:  nm*       [2.3 - 2.9]                RV Base:  nm*       [ < 4.2]         Asc.Aorta:  nm*       [2.3 - 3.1]                 RV Mid:  nm*       [ < 3.5]  LEFT VENTRICLE                                        RV Length:  nm*       [ < 8.6]             LVIDd:  4.8 cm    [3.9 - 5.3]       INFERIOR VENA CAVA             LVIDs:  3.0 cm                              Max. IVC:  nm*       [ <= 2.1]                FS:  37.7 %    [> 25]                    Min. IVC:  nm*               SWT:  1.3 cm    [0.5 - 0.9]                   ------------------               PWT:  1.3 cm    [0.5 - 0.9]                   nm* - not measured  LEFT ATRIUM           LA Diam:  3.4 cm    [2.7 - 3.8]       LA A4C Area:  nm*       [ < 20]         LA Volume:  nm*       [22 - 52] ___________________________________________________________________________________________  ECHOCARDIOGRAPHIC DESCRIPTIONS  AORTIC ROOT         Size:Normal   Dissection:INDETERM FOR DISSECTION  AORTIC VALVE     Leaflets:Tricuspid         Morphology:MILDLY THICKENED     Mobility:PARTIALLY MOBILE  LEFT VENTRICLE         Size:Normal              Anterior:Normal  Contraction:Normal               Lateral:Normal  Closest EF:>55% (Estimated)      Septal:Normal    LV Masses:No Masses             Apical:Normal          [place]:[id number] LVH            Inferior:Normal                                 Posterior:Normal Dias.FxClass:(Grade 1) relaxation abnormal, E/A reversal  MITRAL VALVE     Leaflets:Normal              Mobility:Fully mobile   Morphology:Normal  LEFT ATRIUM         Size:MILDLY ENLARGED    LA Masses:No masses    IA Septum:Normal IAS  MAIN PA         Size:Normal  PULMONIC VALVE   Morphology:Normal              Mobility:Fully mobile  RIGHT VENTRICLE    RV Masses:No Masses                Size:Normal    Free Wall:Normal           Contraction:Normal  TRICUSPID VALVE     Leaflets:Normal              Mobility:Fully mobile   Morphology:Normal  RIGHT ATRIUM         Size:Normal              RA Other:None      RA Mass:No masses  PERICARDIUM        Fluid:No effusion  INFERIOR VENACAVA         Size:Normal Normal respiratory collapse  _____________________________________________________________________  DOPPLER ECHO and OTHER SPECIAL PROCEDURES    Aortic:TRIVIAL AR                     MILD AS           289.0 cm/sec peak vel          33.4 mmHg peak grad           17.0 mmHg mean grad            1.8 cm^2 by DOPPLER     Mitral:TRIVIAL MR                     No MS           MV Inflow E Vel=109.0 cm/sec   MV Annulus E'Vel=7.1 cm/sec           E/E'Ratio=15.4  Tricuspid:TRIVIAL TR                     No TS           150.0 cm/sec peak TR vel       14.0 mmHg peak RV pressure  Pulmonary:TRIVIAL PR                     No PS    ___________________________________________________________________________________________ INTERPRETATION NORMAL LEFT VENTRICULAR SYSTOLIC FUNCTION   WITH MILD LVH NORMAL RIGHT VENTRICULAR SYSTOLIC FUNCTION TRIVIAL REGURGITATION NOTED (See above) MILD VALVULAR STENOSIS (See above) CALCULATED AVA = 1.8 cm^2 BY CONTINUITY EQUATION   ___________________________________________________________________________________________ Electronically signed by: MD Miquel Dunn on 01/05/2015 02:09 PM             Performed By: Johnathan Hausen, RDCS, RVT  Ordering Physician: Loistine Simas  ___________________________________________________________________________________________   Status Results Details   Encounter Summary  November

## 2015-04-02 NOTE — Telephone Encounter (Signed)
Staff from [facility] called over and states that patient now has [place], ID number is [id number] and does not require authorization or a referral.

## 2015-04-03 ENCOUNTER — Ambulatory Visit: Payer: Self-pay | Admitting: Surgery

## 2015-04-03 NOTE — Pre-Procedure Instructions (Signed)
RECEIVED OK FROM DR CALLWOOD TO PROCEED AFTER REVIEWING EKG FROM 04/01/15

## 2015-04-08 ENCOUNTER — Telehealth: Payer: Self-pay | Admitting: Surgery

## 2015-04-08 NOTE — Telephone Encounter (Signed)
Pt advised of pre op date/time and sx date. Sx: 04/11/15 with Dr Berta Minor assisting--Laparoscopic right colectomy.  Pre op: patient has completed Pre op.   Patient made aware to call [phone], between 1-3:00pm the day before surgery, to find out what time to arrive.    Authorization has been obtained for CPT: 44205.

## 2015-04-10 ENCOUNTER — Ambulatory Visit: Payer: Self-pay | Admitting: Surgery

## 2015-04-11 ENCOUNTER — Encounter: Payer: Self-pay | Admitting: *Deleted

## 2015-04-11 ENCOUNTER — Encounter
Admission: AD | Disposition: A | Discharge status: Home / Self Care | Payer: Self-pay | Source: Ambulatory Visit | Attending: Surgery

## 2015-04-11 ENCOUNTER — Ambulatory Visit: Payer: PPO | Admitting: Anesthesiology

## 2015-04-11 ENCOUNTER — Ambulatory Visit: Payer: PPO

## 2015-04-11 ENCOUNTER — Inpatient Hospital Stay
Admission: AD | Admit: 2015-04-11 | Discharge: 2015-04-14 | DRG: 330 | Disposition: A | Discharge status: Home / Self Care | Payer: PPO | Source: Ambulatory Visit | Attending: Surgery | Admitting: Surgery

## 2015-04-11 DIAGNOSIS — K565 Intestinal adhesions [bands] with obstruction (postprocedural) (postinfection): Secondary | ICD-10-CM | POA: Diagnosis not present

## 2015-04-11 DIAGNOSIS — E1151 Type 2 diabetes mellitus with diabetic peripheral angiopathy without gangrene: Secondary | ICD-10-CM | POA: Diagnosis not present

## 2015-04-11 DIAGNOSIS — K219 Gastro-esophageal reflux disease without esophagitis: Secondary | ICD-10-CM | POA: Diagnosis present

## 2015-04-11 DIAGNOSIS — M1990 Unspecified osteoarthritis, unspecified site: Secondary | ICD-10-CM | POA: Diagnosis present

## 2015-04-11 DIAGNOSIS — D122 Benign neoplasm of ascending colon: Secondary | ICD-10-CM | POA: Diagnosis not present

## 2015-04-11 DIAGNOSIS — K635 Polyp of colon: Secondary | ICD-10-CM | POA: Diagnosis not present

## 2015-04-11 DIAGNOSIS — E785 Hyperlipidemia, unspecified: Secondary | ICD-10-CM | POA: Diagnosis present

## 2015-04-11 DIAGNOSIS — E119 Type 2 diabetes mellitus without complications: Secondary | ICD-10-CM | POA: Diagnosis not present

## 2015-04-11 DIAGNOSIS — I10 Essential (primary) hypertension: Secondary | ICD-10-CM | POA: Diagnosis not present

## 2015-04-11 DIAGNOSIS — K388 Other specified diseases of appendix: Secondary | ICD-10-CM | POA: Diagnosis not present

## 2015-04-11 DIAGNOSIS — Z7984 Long term (current) use of oral hypoglycemic drugs: Secondary | ICD-10-CM | POA: Diagnosis not present

## 2015-04-11 DIAGNOSIS — Z6841 Body Mass Index (BMI) 40.0 and over, adult: Secondary | ICD-10-CM

## 2015-04-11 DIAGNOSIS — D125 Benign neoplasm of sigmoid colon: Secondary | ICD-10-CM | POA: Diagnosis not present

## 2015-04-11 DIAGNOSIS — I739 Peripheral vascular disease, unspecified: Secondary | ICD-10-CM | POA: Diagnosis not present

## 2015-04-11 HISTORY — PX: CYSTOSCOPY WITH STENT PLACEMENT: SHX5790

## 2015-04-11 HISTORY — PX: LAPAROSCOPIC RIGHT COLECTOMY: SHX5925

## 2015-04-11 LAB — GLUCOSE, CAPILLARY
Glucose-Capillary: 182 mg/dL — ABNORMAL HIGH (ref 65–99)
Glucose-Capillary: 222 mg/dL — ABNORMAL HIGH (ref 65–99)
Glucose-Capillary: 194 mg/dL — ABNORMAL HIGH (ref 65–99)
Glucose-Capillary: 148 mg/dL — ABNORMAL HIGH (ref 65–99)

## 2015-04-11 SURGERY — COLECTOMY, RIGHT, LAPAROSCOPIC
Anesthesia: General | Laterality: Right | Wound class: Clean Contaminated

## 2015-04-11 MED ORDER — HYDRALAZINE HCL 20 MG/ML IJ SOLN: 10.0000 mg | INTRAMUSCULAR | Status: DC | PRN

## 2015-04-11 MED ORDER — PHENYLEPHRINE HCL 10 MG/ML IJ SOLN
10.0000 mg | INTRAVENOUS | Status: DC | PRN
  Administered 2015-04-11: 30 ug/min via INTRAVENOUS

## 2015-04-11 MED ORDER — LISINOPRIL 10 MG PO TABS
10.0000 mg | ORAL_TABLET | Freq: Every day | ORAL | Status: DC
  Administered 2015-04-12 – 2015-04-14 (×3): 10 mg via ORAL
  Filled 2015-04-11 (×3): qty 1

## 2015-04-11 MED ORDER — PHENYLEPHRINE HCL 10 MG/ML IJ SOLN
INTRAMUSCULAR | Status: DC | PRN
  Administered 2015-04-11: 100 ug via INTRAVENOUS

## 2015-04-11 MED ORDER — ROCURONIUM BROMIDE 100 MG/10ML IV SOLN
INTRAVENOUS | Status: DC | PRN
  Administered 2015-04-11 (×2): 20 mg via INTRAVENOUS
  Administered 2015-04-11: 30 mg via INTRAVENOUS
  Administered 2015-04-11: 20 mg via INTRAVENOUS
  Administered 2015-04-11: 10 mg via INTRAVENOUS
  Administered 2015-04-11: 50 mg via INTRAVENOUS

## 2015-04-11 MED ORDER — OXYCODONE HCL 5 MG/5ML PO SOLN: 5.0000 mg | Freq: Once | ORAL | Status: DC | PRN

## 2015-04-11 MED ORDER — LACTATED RINGERS IV SOLN
INTRAVENOUS | Status: DC
  Administered 2015-04-11 – 2015-04-13 (×7): via INTRAVENOUS

## 2015-04-11 MED ORDER — BUPIVACAINE HCL (PF) 0.25 % IJ SOLN
INTRAMUSCULAR | Status: DC | PRN
  Administered 2015-04-11: 18 mL/h

## 2015-04-11 MED ORDER — ENOXAPARIN SODIUM 40 MG/0.4ML ~~LOC~~ SOLN
40.0000 mg | Freq: Two times a day (BID) | SUBCUTANEOUS | Status: DC
  Administered 2015-04-12 – 2015-04-14 (×5): 40 mg via SUBCUTANEOUS
  Filled 2015-04-11 (×5): qty 0.4

## 2015-04-11 MED ORDER — ONDANSETRON HCL 4 MG/2ML IJ SOLN
4.0000 mg | Freq: Four times a day (QID) | INTRAMUSCULAR | Status: DC | PRN
  Administered 2015-04-11 – 2015-04-12 (×2): 4 mg via INTRAVENOUS
  Filled 2015-04-11 (×2): qty 2

## 2015-04-11 MED ORDER — LIDOCAINE HCL (CARDIAC) 20 MG/ML IV SOLN
INTRAVENOUS | Status: DC | PRN
  Administered 2015-04-11: 100 mg via INTRAVENOUS

## 2015-04-11 MED ORDER — GABAPENTIN 100 MG PO CAPS
100.0000 mg | ORAL_CAPSULE | Freq: Three times a day (TID) | ORAL | Status: DC
  Administered 2015-04-11 – 2015-04-14 (×9): 100 mg via ORAL
  Filled 2015-04-11 (×9): qty 1

## 2015-04-11 MED ORDER — KETOROLAC TROMETHAMINE 15 MG/ML IJ SOLN
15.0000 mg | Freq: Four times a day (QID) | INTRAMUSCULAR | Status: DC
  Administered 2015-04-11 – 2015-04-14 (×13): 15 mg via INTRAVENOUS
  Filled 2015-04-11 (×13): qty 1

## 2015-04-11 MED ORDER — ACETAMINOPHEN 10 MG/ML IV SOLN
INTRAVENOUS | Status: AC
  Filled 2015-04-11: qty 100

## 2015-04-11 MED ORDER — SODIUM CHLORIDE 0.9 % IR SOLN
Status: DC | PRN
  Administered 2015-04-11: 350 mL

## 2015-04-11 MED ORDER — SODIUM CHLORIDE 0.9 % IV SOLN
INTRAVENOUS | Status: DC
  Administered 2015-04-11 (×3): via INTRAVENOUS

## 2015-04-11 MED ORDER — CYCLOBENZAPRINE HCL 10 MG PO TABS
5.0000 mg | ORAL_TABLET | Freq: Three times a day (TID) | ORAL | Status: DC
  Administered 2015-04-11 – 2015-04-14 (×9): 5 mg via ORAL
  Filled 2015-04-11 (×9): qty 1

## 2015-04-11 MED ORDER — CHLORHEXIDINE GLUCONATE 4 % EX LIQD: 1.0000 "application " | Freq: Once | CUTANEOUS | Status: DC

## 2015-04-11 MED ORDER — DEXAMETHASONE SODIUM PHOSPHATE 4 MG/ML IJ SOLN
INTRAMUSCULAR | Status: DC | PRN
  Administered 2015-04-11: 5 mg via INTRAVENOUS

## 2015-04-11 MED ORDER — INSULIN ASPART 100 UNIT/ML ~~LOC~~ SOLN
0.0000 [IU] | Freq: Three times a day (TID) | SUBCUTANEOUS | Status: DC
  Administered 2015-04-11: 4 [IU] via SUBCUTANEOUS
  Administered 2015-04-12: 3 [IU] via SUBCUTANEOUS
  Administered 2015-04-12 (×2): 4 [IU] via SUBCUTANEOUS
  Filled 2015-04-11: qty 4
  Filled 2015-04-11: qty 3
  Filled 2015-04-11 (×2): qty 4

## 2015-04-11 MED ORDER — ACETAMINOPHEN 500 MG PO TABS
1000.0000 mg | ORAL_TABLET | Freq: Four times a day (QID) | ORAL | Status: DC
  Administered 2015-04-11 – 2015-04-14 (×12): 1000 mg via ORAL
  Filled 2015-04-11 (×13): qty 2

## 2015-04-11 MED ORDER — INSULIN ASPART 100 UNIT/ML ~~LOC~~ SOLN
5.0000 [IU] | Freq: Once | SUBCUTANEOUS | Status: AC
  Administered 2015-04-11: 5 [IU] via SUBCUTANEOUS

## 2015-04-11 MED ORDER — ENOXAPARIN SODIUM 40 MG/0.4ML ~~LOC~~ SOLN
40.0000 mg | Freq: Once | SUBCUTANEOUS | Status: AC
  Administered 2015-04-11: 40 mg via SUBCUTANEOUS
  Filled 2015-04-11: qty 0.4

## 2015-04-11 MED ORDER — OXYCODONE HCL 5 MG PO TABS
5.0000 mg | ORAL_TABLET | ORAL | Status: DC | PRN
  Administered 2015-04-12 (×2): 5 mg via ORAL
  Filled 2015-04-11 (×2): qty 1

## 2015-04-11 MED ORDER — LACTATED RINGERS IV SOLN
INTRAVENOUS | Status: DC | PRN
  Administered 2015-04-11: 08:00:00 via INTRAVENOUS

## 2015-04-11 MED ORDER — ACETAMINOPHEN 10 MG/ML IV SOLN
INTRAVENOUS | Status: DC | PRN
  Administered 2015-04-11: 1000 mg via INTRAVENOUS

## 2015-04-11 MED ORDER — ATORVASTATIN CALCIUM 20 MG PO TABS
80.0000 mg | ORAL_TABLET | Freq: Every day | ORAL | Status: DC
  Administered 2015-04-11 – 2015-04-13 (×3): 80 mg via ORAL
  Filled 2015-04-11 (×3): qty 4

## 2015-04-11 MED ORDER — ASPIRIN 81 MG PO CHEW
81.0000 mg | CHEWABLE_TABLET | Freq: Every day | ORAL | Status: DC
  Administered 2015-04-11 – 2015-04-14 (×4): 81 mg via ORAL
  Filled 2015-04-11 (×4): qty 1

## 2015-04-11 MED ORDER — DEXTROSE 5 % IV SOLN
2.0000 g | Freq: Four times a day (QID) | INTRAVENOUS | Status: DC
  Administered 2015-04-11 – 2015-04-14 (×12): 2 g via INTRAVENOUS
  Filled 2015-04-11 (×19): qty 2

## 2015-04-11 MED ORDER — ONDANSETRON 4 MG PO TBDP
4.0000 mg | ORAL_TABLET | Freq: Four times a day (QID) | ORAL | Status: DC | PRN
  Filled 2015-04-11: qty 1

## 2015-04-11 MED ORDER — KETAMINE HCL 10 MG/ML IJ SOLN
INTRAMUSCULAR | Status: DC | PRN
  Administered 2015-04-11: 50 mg via INTRAVENOUS

## 2015-04-11 MED ORDER — ONDANSETRON HCL 4 MG/2ML IJ SOLN
INTRAMUSCULAR | Status: DC | PRN
  Administered 2015-04-11: 4 mg via INTRAVENOUS

## 2015-04-11 MED ORDER — SUGAMMADEX SODIUM 200 MG/2ML IV SOLN
INTRAVENOUS | Status: DC | PRN
  Administered 2015-04-11: 200 mg via INTRAVENOUS

## 2015-04-11 MED ORDER — PROPOFOL 10 MG/ML IV BOLUS
INTRAVENOUS | Status: DC | PRN
  Administered 2015-04-11: 130 mg via INTRAVENOUS
  Administered 2015-04-11: 40 mg via INTRAVENOUS

## 2015-04-11 MED ORDER — DEXTROSE 5 % IV SOLN
2.0000 g | Freq: Once | INTRAVENOUS | Status: DC
  Filled 2015-04-11: qty 2

## 2015-04-11 MED ORDER — MORPHINE SULFATE (PF) 2 MG/ML IV SOLN: 2.0000 mg | INTRAVENOUS | Status: DC | PRN

## 2015-04-11 MED ORDER — PROMETHAZINE HCL 25 MG/ML IJ SOLN
12.5000 mg | INTRAMUSCULAR | Status: DC | PRN
  Administered 2015-04-11: 12.5 mg via INTRAVENOUS
  Filled 2015-04-11: qty 1

## 2015-04-11 MED ORDER — CEFOXITIN SODIUM-DEXTROSE 2-2.2 GM-% IV SOLR (PREMIX)
2.0000 g | Freq: Once | INTRAVENOUS | Status: AC
  Administered 2015-04-11: 2000 mg via INTRAVENOUS

## 2015-04-11 MED ORDER — OXYCODONE HCL 5 MG PO TABS: 5.0000 mg | ORAL_TABLET | Freq: Once | ORAL | Status: DC | PRN

## 2015-04-11 MED ORDER — BUPIVACAINE HCL (PF) 0.25 % IJ SOLN
INTRAMUSCULAR | Status: AC
  Filled 2015-04-11: qty 30

## 2015-04-11 MED ORDER — FENTANYL CITRATE (PF) 100 MCG/2ML IJ SOLN
25.0000 ug | INTRAMUSCULAR | Status: DC | PRN
  Administered 2015-04-11 (×4): 25 ug via INTRAVENOUS

## 2015-04-11 MED ORDER — METOPROLOL TARTRATE 1 MG/ML IV SOLN
INTRAVENOUS | Status: DC | PRN
  Administered 2015-04-11: 1 mg via INTRAVENOUS
  Administered 2015-04-11: 2 mg via INTRAVENOUS

## 2015-04-11 MED ORDER — INSULIN ASPART 100 UNIT/ML ~~LOC~~ SOLN: 0.0000 [IU] | Freq: Every day | SUBCUTANEOUS | Status: DC

## 2015-04-11 MED ORDER — PANTOPRAZOLE SODIUM 40 MG PO TBEC
40.0000 mg | DELAYED_RELEASE_TABLET | Freq: Every day | ORAL | Status: DC
  Administered 2015-04-11 – 2015-04-14 (×4): 40 mg via ORAL
  Filled 2015-04-11 (×4): qty 1

## 2015-04-11 MED ORDER — FENTANYL CITRATE (PF) 100 MCG/2ML IJ SOLN
INTRAMUSCULAR | Status: DC | PRN
  Administered 2015-04-11: 250 ug via INTRAVENOUS
  Administered 2015-04-11: 50 ug via INTRAVENOUS
  Administered 2015-04-11: 250 ug via INTRAVENOUS

## 2015-04-11 SURGICAL SUPPLY — 74 items
APPLIER CLIP 5 13 M/L LIGAMAX5 (MISCELLANEOUS)
BAG DRAIN CYSTO-URO LG1000N (MISCELLANEOUS) ×3 IMPLANT
BLADE SURG SZ10 CARB STEEL (BLADE) ×6 IMPLANT
CANISTER SUCT 1200ML W/VALVE (MISCELLANEOUS) ×3 IMPLANT
CATH TRAY 16F METER LATEX (MISCELLANEOUS) ×3 IMPLANT
CATH URETL 5X70 OPEN END (CATHETERS) ×3 IMPLANT
CHLORAPREP W/TINT 26ML (MISCELLANEOUS) ×3 IMPLANT
CLIP APPLIE 5 13 M/L LIGAMAX5 (MISCELLANEOUS) IMPLANT
CONRAY 43 FOR UROLOGY 50M (MISCELLANEOUS) ×3 IMPLANT
DEFOGGER SCOPE WARMER CLEARIFY (MISCELLANEOUS) ×3 IMPLANT
DRAPE C-SECTION (MISCELLANEOUS) ×3 IMPLANT
DRAPE LEGGINS SURG 28X43 STRL (DRAPES) ×3 IMPLANT
DRAPE UNDER BUTTOCK W/FLU (DRAPES) ×6 IMPLANT
DRAPE UTILITY 15X26 TOWEL STRL (DRAPES) ×6 IMPLANT
ELECT CAUTERY BLADE 6.4 (BLADE) ×3 IMPLANT
ELECT REM PT RETURN 9FT ADLT (ELECTROSURGICAL) ×3
ELECTRODE REM PT RTRN 9FT ADLT (ELECTROSURGICAL) ×1 IMPLANT
GLOVE BIO SURGEON STRL SZ 6.5 (GLOVE) ×2 IMPLANT
GLOVE BIO SURGEONS STRL SZ 6.5 (GLOVE) ×1
GLOVE PI ORTHOPRO 6.5 (GLOVE) ×4
GLOVE PI ORTHOPRO STRL 6.5 (GLOVE) ×2 IMPLANT
GOWN STRL REUS W/ TWL LRG LVL3 (GOWN DISPOSABLE) ×4 IMPLANT
GOWN STRL REUS W/ TWL LRG LVL4 (GOWN DISPOSABLE) ×2 IMPLANT
GOWN STRL REUS W/TWL LRG LVL3 (GOWN DISPOSABLE) ×8
GOWN STRL REUS W/TWL LRG LVL4 (GOWN DISPOSABLE) ×4
GRASPER SUT TROCAR 14GX15 (MISCELLANEOUS) IMPLANT
HANDLE YANKAUER SUCT BULB TIP (MISCELLANEOUS) ×3 IMPLANT
IRRIGATION STRYKERFLOW (MISCELLANEOUS) ×1 IMPLANT
IRRIGATOR STRYKERFLOW (MISCELLANEOUS) ×3
IV NS 1000ML (IV SOLUTION) ×2
IV NS 1000ML BAXH (IV SOLUTION) ×1 IMPLANT
KIT RM TURNOVER CYSTO AR (KITS) ×6 IMPLANT
LABEL OR SOLS (LABEL) ×3 IMPLANT
LIGASURE BLUNT 5MM 37CM (INSTRUMENTS) ×3 IMPLANT
LIQUID BAND (GAUZE/BANDAGES/DRESSINGS) ×3 IMPLANT
NDL SAFETY 22GX1.5 (NEEDLE) ×3 IMPLANT
NS IRRIG 500ML POUR BTL (IV SOLUTION) ×3 IMPLANT
PACK COLON CLEAN CLOSURE (MISCELLANEOUS) ×3 IMPLANT
PACK CYSTO AR (MISCELLANEOUS) ×3 IMPLANT
PACK LAP CHOLECYSTECTOMY (MISCELLANEOUS) ×3 IMPLANT
PENCIL ELECTRO HAND CTR (MISCELLANEOUS) ×3 IMPLANT
PREP PVP WINGED SPONGE (MISCELLANEOUS) ×3 IMPLANT
RELOAD STAPLER BLUE 60MM (STAPLE) ×4 IMPLANT
RETRACTOR WOUND ALXS 18CM SML (MISCELLANEOUS) ×1 IMPLANT
RTRCTR WOUND ALEXIS O 18CM SML (MISCELLANEOUS) ×3
SEALER TISSUE G2 STRG ARTC 35C (ENDOMECHANICALS) IMPLANT
SENSORWIRE 0.038 NOT ANGLED (WIRE) ×3
SET CYSTO W/LG BORE CLAMP LF (SET/KITS/TRAYS/PACK) ×3 IMPLANT
SHEARS HARMONIC ACE PLUS 36CM (ENDOMECHANICALS) IMPLANT
SLEEVE ENDOPATH XCEL 5M (ENDOMECHANICALS) ×6 IMPLANT
SOL .9 NS 3000ML IRR  AL (IV SOLUTION) ×2
SOL .9 NS 3000ML IRR UROMATIC (IV SOLUTION) ×1 IMPLANT
SOL PREP PVP 2OZ (MISCELLANEOUS) ×3
SOLUTION PREP PVP 2OZ (MISCELLANEOUS) ×1 IMPLANT
SPONGE LAP 18X18 5 PK (GAUZE/BANDAGES/DRESSINGS) ×3 IMPLANT
SPONGE LAP 18X36 2PK (MISCELLANEOUS) ×3 IMPLANT
STAPLE ECHEON FLEX 60 POW ENDO (STAPLE) ×3 IMPLANT
STAPLER RELOAD BLUE 60MM (STAPLE) ×12
STAPLER SKIN PROX 35W (STAPLE) IMPLANT
STENT URET 6FRX24 CONTOUR (STENTS) IMPLANT
STENT URET 6FRX26 CONTOUR (STENTS) IMPLANT
SURGILUBE 2OZ TUBE FLIPTOP (MISCELLANEOUS) ×3 IMPLANT
SUT MNCRL AB 4-0 PS2 18 (SUTURE) ×6 IMPLANT
SUT PDS 2-0 27IN (SUTURE) ×12 IMPLANT
SUT SILK 3-0 (SUTURE) ×3 IMPLANT
SUT VICRYL 0 AB UR-6 (SUTURE) ×6 IMPLANT
SYRINGE 10CC LL (SYRINGE) ×3 IMPLANT
SYRINGE IRR TOOMEY STRL 70CC (SYRINGE) ×3 IMPLANT
TROCAR XCEL 12X100 BLDLESS (ENDOMECHANICALS) ×6 IMPLANT
TROCAR XCEL BLUNT TIP 100MML (ENDOMECHANICALS) ×3 IMPLANT
TROCAR XCEL NON-BLD 5MMX100MML (ENDOMECHANICALS) ×3 IMPLANT
TUBING INSUFFLATOR HEATED (MISCELLANEOUS) ×3 IMPLANT
WATER STERILE IRR 1000ML POUR (IV SOLUTION) ×3 IMPLANT
WIRE SENSOR 0.038 NOT ANGLED (WIRE) ×1 IMPLANT

## 2015-04-11 NOTE — Op Note (Signed)
Laparoscopic Partial Colectomy Procedure Note  Indications: This patient presents for a laparoscopic right hemicolectomy for an incompletely removed and evaluated polyp of the ileocecal value. The patient underwent a complete mechanical and antibiotic bowel prep prior to operation.  Pre-operative Diagnosis: 65mm polyp of ileocecal valve  Post-operative Diagnosis: same  Surgeon: Hubbard Robinson   Procedure:1. Laparoscopic Right Hemicolectomy 2. Extensive lysis of adhesions from hepatic flexure   Assistant: Caroleen Hamman, MD  Anesthesia: General endotracheal anesthesia  ASA Class: 3  Procedure Details  The patient was seen in the Holding Room. The risks, benefits, complications, treatment options, and expected outcomes were discussed with the patient. The possibilities of reaction to medication, pulmonary aspiration, perforation of viscus, bleeding, recurrent infection, finding a normal colon, the need for additional procedures, failure to diagnose a condition, and creating a complication requiring transfusion or operation were discussed with the patient. The patient concurred with the proposed plan, giving informed consent.   The patient was taken to the operating room, identified as Carly Bell and the procedure verified as partial colectomy. A Time Out was held and the above information confirmed.  The patient was brought to the operating room and placed supine. After induction of a general anesthetic, patient was placed in lithotomy and Dr. Erlene Quan performed cystoscopy and placement of right sided ureteral stent, please see her note for full details.  After that was completed the patient was then placed supine and out of lithotomy. The patient is abdomen was then prepped and draped in usual sterile fashion. One-half percent Marcaine with epinephrine was used to anesthetize the skin around the epigastrium.  5 mm trocar was then used to Optiview and under direct vision through the anterior  and posterior fascia and into the abdomen. Abdomen was then insufflated with CO2 not to exceed 15 mmHg patient tolerated this well. `    Exploration revealed a normal omentum, colon, small bowel, peritoneum, liver, and stomach. A midline and right lateral 5-mm trocar as well as 98mm trochar at the umbilicus were then placed after anesthetizing the skin and peritoneum with Marcaine. The terminal ileum and cecum were retracted medially while the LigaSure device was used to dissect laterally up and along the line of [place]. Dissection was easy until up at the hepatic flexure where there was extensive adhesions from a previous cholecystectomy. At this point lysis of adhesions required careful dissection off of the liver and around and told through the transverse colon as well as omentum had to be dissected off of the liver. This is able to be done carefully with a LigaSure device with good hemostasis however added approximately 60 minutes to the case. dissection was then done around the terminal ileum elevating up the appendix and the cecum as well this was done easily in a bloodless field.  The ureter was not identified during this mobilization process but no structures were divided during this mobilization. There was no blood loss during this portion of the procedure.    At this point dissection was turned to the medial portion where the cecum and terminal ileum were elevated up and the ileocolonic vessels identified. The LigaSure device was used to gently divide the anteriorly air of the mesentery this line was dissected toward the terminal ileum there were no structures identified during this dissection.  At this point a GIA leper scopic stapler was used to staple across the terminal ileum. Terminal ileum was then elevated up to create tension along the mesentery and LigaSure device was  then used to make an incision in the mesentery and tracking up toward the transverse colon. The duodenum was identified and was  swept down during this process as well.  The division was very difficult due to the patient's obesity and amount of increased abdominal fat.  The increased abdominal fat added difficulty visualizing important structures as well as added additional time to the case because of additional division of structures. Once this was completed up to the transverse colon the transverse colon was cleaned off using the LigaSure device. The colon was elevated up to the abdominal wall and seen that it would fit easily in that area.   At this point Bovie electric cautery was used to enlarge the incision around the 12 mm trocar to approximately 4 cm in size since dissected down to fascia and the abdomen entered. The wound protector was then placed in this area the colon was removed through here and the stapler used to staple off at the transverse colon. This specimen was then placed on the back table it was later open to reveal a very thickened ileocecal valve however no other distinct polyp was found.    The other end of the ileum was elevated through the wound protector as well and a side to side anastomosis was performed through the small anterior incision with the linear stapling device. The mucosa was inspected and found to be hemostatic. Closure was achieved with the linear stapling device. A 3-0 Silk suture was used to reapproximate the angle of the anastomosis. Hemostasis was confirmed. The bowel anastomosis was returned to the abdomen. The wound protector was twisted and clamped with a Kelly and the abdomen reinsufflated. The abdomen was then inspected and ensured that the anastomosis was placed well inside the abdomen without any twisting.  The omentum was then placed over the anastomosis. And the abdomen was irrigated out. The 5 mm trocars were removed from the abdomen under direct visualization.  The incision was then closed with a running 2-0 PDS suture. The soft tissue was irrigated and the incisions were closed  with 4-0 Monocryl subcuticular closure at the umbilical site as well as the three other 70mm port sites. Sterile glue was applied to the incisions.    Instrument, sponge, and needle counts were correct prior to abdominal closure and at the conclusion of the case.   Findings: adhesions at the hepatic flexure requiring extensive lysis of adhesions, thickening of the ileocecal valve however no distinct polyp.  Estimated Blood Loss: 150cc         Drains: none         Total IV Fluids: 2064mL         Specimens: right colon            Complications: None; patient tolerated the procedure well.         Disposition: PACU - hemodynamically stable.         Condition: stable

## 2015-04-11 NOTE — Anesthesia Procedure Notes (Signed)
Procedure Name: Intubation Date/Time: 04/11/2015 7:44 AM Performed by: Rosaria Ferries, Mychal Decarlo Pre-anesthesia Checklist: Patient identified, Emergency Drugs available, Suction available and Patient being monitored Patient Re-evaluated:Patient Re-evaluated prior to inductionOxygen Delivery Method: Circle system utilized Preoxygenation: Pre-oxygenation with 100% oxygen Intubation Type: IV induction Laryngoscope Size: Mac and 3 Grade View: Grade I Tube type: Oral Tube size: 7.0 mm Number of attempts: 1 Placement Confirmation: ETT inserted through vocal cords under direct vision,  positive ETCO2 and breath sounds checked- equal and bilateral Secured at: 22 cm Tube secured with: Tape Dental Injury: Teeth and Oropharynx as per pre-operative assessment

## 2015-04-11 NOTE — Interval H&P Note (Signed)
History and Physical Interval Note:  04/11/2015 7:20 AM  Carly Bell  has presented today for surgery, with the diagnosis of BENIGN NEOPLASM OF ASCENDING COLON  The various methods of treatment have been discussed with the patient and family. After consideration of risks, benefits and other options for treatment, the patient has consented to  Procedure(s): LAPAROSCOPIC RIGHT COLECTOMY (Right) CYSTOSCOPY WITH STENT PLACEMENT (Bilateral) as a surgical intervention .  The patient's history has been reviewed, patient examined, no change in status, stable for surgery.  I have reviewed the patient's chart and labs.  Questions were answered to the patient's satisfaction.     Cardarius Senat L Rakeya Glab

## 2015-04-11 NOTE — H&P (View-Only) (Signed)
Subjective:     Patient ID: Carly Bell, female   DOB: 1940/09/04, 75 y.o.   MRN: [id number]  HPI  75 yr old female with large 59mm polyp at ileocecal valve. Patient originally evaluated for Lap colectomy with Dr. Tamala Julian, but he was unable to do her operation.  She is here today to meet with me.  She is not having any nausea, vomiting, abdominal pain, diarrhea, constipation, melena or dysuria.   Past Medical History  Diagnosis Date  . Diabetes mellitus without complication ([place])   . Hypertension   . Hyperlipidemia   . Cardiac murmur Diagnosed in 2007  . Arthritis   . GERD (gastroesophageal reflux disease)    Past Surgical History  Procedure Laterality Date  . Tonsillectomy  as child  . Dilation and curettage of uterus  Unsure of Date  . Colonoscopy with propofol N/A 12/25/2014    Procedure: COLONOSCOPY WITH PROPOFOL;  Surgeon: Lollie Sails, MD;  Location: [facility] ENDOSCOPY;  Service: Endoscopy;  Laterality: N/A;  . Breast biopsy Left 1990's    neg- Done in Radiology  . Cholecystectomy  2008  . Eye surgery Bilateral 2012    Cataract Removal- [facility]  . Colonoscopy w/ polypectomy  07/2008    Dr. Rochel Brome   Family History  Problem Relation Age of Onset  . Prostate cancer Father   . Alzheimer's disease Mother   . Diabetes Sister   . Diabetes Brother    Social History   Social History  . Marital Status: Widowed    Spouse Name: N/A  . Number of Children: N/A  . Years of Education: N/A   Social History Main Topics  . Smoking status: Never Smoker   . Smokeless tobacco: Never Used  . Alcohol Use: No  . Drug Use: No  . Sexual Activity: Not Asked   Other Topics Concern  . None   Social History Narrative    Current outpatient prescriptions:  .  acetaminophen (TYLENOL ARTHRITIS PAIN) 650 MG CR tablet, Take 650 mg by mouth every 8 (eight) hours as needed for pain., Disp: , Rfl:  .  aspirin 81 MG tablet, Take 81 mg by mouth daily., Disp: , Rfl:  .   aspirin-acetaminophen-caffeine (EXCEDRIN MIGRAINE) 250-250-65 MG tablet, Take by mouth every 6 (six) hours as needed for headache., Disp: , Rfl:  .  atorvastatin (LIPITOR) 80 MG tablet, Take 80 mg by mouth at bedtime. , Disp: , Rfl:  .  cholecalciferol (VITAMIN D) 400 UNITS TABS tablet, Take 400 Units by mouth daily. , Disp: , Rfl:  .  fosinopril (MONOPRIL) 10 MG tablet, Take 10 mg by mouth daily., Disp: , Rfl:  .  gabapentin (NEURONTIN) 100 MG capsule, Take 100 mg by mouth 3 (three) times daily., Disp: , Rfl:  .  glimepiride (AMARYL) 4 MG tablet, Take 4 mg by mouth daily with breakfast., Disp: , Rfl:  .  metFORMIN (GLUCOPHAGE) 500 MG tablet, Take by mouth 2 (two) times daily with a meal., Disp: , Rfl:  .  omeprazole (PRILOSEC) 40 MG capsule, Take 40 mg by mouth daily., Disp: , Rfl:  Allergies  Allergen Reactions  . Codeine   . Vicodin [Hydrocodone-Acetaminophen]     Review of Systems  Constitutional: Negative for fever, chills, activity change and appetite change.  HENT: Negative for congestion and sore throat.   Respiratory: Negative for cough, chest tightness, shortness of breath and wheezing.   Cardiovascular: Negative for chest pain, palpitations and leg swelling.  Gastrointestinal: Negative for nausea, vomiting, diarrhea, constipation and abdominal distention.  Genitourinary: Negative for dysuria, hematuria and flank pain.  Musculoskeletal: Negative for back pain and arthralgias.  Skin: Negative for color change, pallor, rash and wound.  Neurological: Negative for dizziness and tremors.  Hematological: Negative for adenopathy. Does not bruise/bleed easily.  Psychiatric/Behavioral: Negative for decreased concentration and agitation.       Objective:   Physical Exam  Constitutional: She is oriented to person, place, and time. She appears well-developed and well-nourished. No distress.  HENT:  Head: Normocephalic and atraumatic.  Right Ear: External ear normal.  Left Ear:  External ear normal.  Nose: Nose normal.  Mouth/Throat: Oropharynx is clear and moist. No oropharyngeal exudate.  Eyes: Conjunctivae and EOM are normal. Pupils are equal, round, and reactive to light. No scleral icterus.  Neck: Neck supple. No tracheal deviation present.  Cardiovascular: Normal rate, regular rhythm, normal heart sounds and intact distal pulses.  Exam reveals no gallop and no friction rub.   No murmur heard. Pulmonary/Chest: Effort normal and breath sounds normal. No respiratory distress. She has no wheezes. She has no rales.  Abdominal: Soft. Bowel sounds are normal. She exhibits no distension. There is no tenderness. There is no rebound.  Musculoskeletal: Normal range of motion. She exhibits no edema or tenderness.  Neurological: She is alert and oriented to person, place, and time. No cranial nerve deficit.  Skin: Skin is warm and dry. No rash noted. No erythema. No pallor.  Psychiatric: She has a normal mood and affect. Her behavior is normal. Judgment and thought content normal.  Vitals reviewed.      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    Plan:     I discussed the risk benefits alternatives and treatment options with the patient to include Laparoscopic resection of the colon possible open, possible ostomy placement.I discussed with them that the risk of surgery were bleeding, infection, anastomotic leak, abscess formation, need for drain placement, damage to the ureters, damage to bladder, need for prolonged catheter after surgery, possible need to convert to open procedure, possible need for ostomy placement, possible need for blood products during surgery, possible need for reoperation; as well as anesthetic risk of heart attack stroke and blood clots and death.  I also discussed with her that his length of hospital stay will be anywhere between 5-10 days depending on operation.  She is scheduled for March 16th.

## 2015-04-11 NOTE — Op Note (Signed)
Date of procedure: 04/11/2015  Preoperative diagnosis:  1. Ileocecal valve mass   Postoperative diagnosis:  1. Same as above   Procedure: 1. Cystoscopy 2. Right ureteral stent placement 3. Right retrograde pyelogram  Surgeon: Hollice Espy, MD  Anesthesia: General  Complications: None  Intraoperative findings: Normal right retrograde pyelograms  EBL: minimal  Specimens: none  Drains: 16 French Foley catheter, 5 French open-ended ureteral catheter and right  Indication: Carly Bell is a 76 y.o. patient with 13 mm ileocecal valve polyp undergoing right hemicolectomy by Dr. Azalee Course. I have been asked to place preoperative stents today. Upon further discussion, patient only needs a right ureteral stent given the location of the mass..  After reviewing the management options for treatment, he elected to proceed with the above surgical procedure(s). We have discussed the potential benefits and risks of the procedure, side effects of the proposed treatment, the likelihood of the patient achieving the goals of the procedure, and any potential problems that might occur during the procedure or recuperation. Informed consent has been obtained.  Description of procedure:  The patient was taken to the operating room and general anesthesia was induced.  The patient was placed in the dorsal lithotomy position, prepped and draped in the usual sterile fashion, and preoperative antibiotics were administered. A preoperative time-out was performed.   At this point in time, a rigid 76 French cystoscope was advanced per urethra into the bladder. Attention was turned to the right ureteral orifice which was cannulated using a 5 [place] open-ended ureteral catheter just within the UO. A gentle retrograde pouchogram was performed revealing a normal ureter and a decompressed upper tract collecting system. There are no filling defects or hydronephrosis. The wire was then advanced up to level of the kidney  under fluoroscopic guidance. The open-ended ureteral catheter was advanced over the wire into the renal pelvis. The wire was withdrawn leaving the open-ended ureteral catheter in place and the scope was removed. A 16 French Foley catheter was advanced into the bladder and balloon was filled with 10 cc of sterile water. The ureteral catheter was then attached to the Foley bag using silk ties 2. The stent was attached to a line tubing and a bile bag.  This point time, Dr. Azalee Course proceeded with the remainder of her portion of the surgery.  Plan: Patient's right ureteral catheter may be removed at the end of the case. Foley removal per primary team.  Hollice Espy, M.D.

## 2015-04-11 NOTE — Progress Notes (Signed)
Pt has vomited twice after po medication. MD ordered IV phenergan. VSS.

## 2015-04-11 NOTE — Anesthesia Preprocedure Evaluation (Addendum)
Anesthesia Evaluation  Patient identified by MRN, date of birth, ID band Patient awake    Reviewed: Allergy & Precautions, H&P , NPO status , Patient's Chart, lab work & pertinent test results  History of Anesthesia Complications Negative for: history of anesthetic complications  Airway Mallampati: III  TM Distance: >3 FB Neck ROM: limited    Dental  (+) Poor Dentition, Chipped, Missing, Partial Upper   Pulmonary neg pulmonary ROS, neg shortness of breath,    Pulmonary exam normal breath sounds clear to auscultation       Cardiovascular Exercise Tolerance: Good hypertension, (-) angina+ Peripheral Vascular Disease  (-) Past MI and (-) DOE + Valvular Problems/Murmurs (Mild AS) AS  Rhythm:regular Rate:Normal + Systolic murmurs    Neuro/Psych negative neurological ROS  negative psych ROS   GI/Hepatic Neg liver ROS, GERD  Controlled,  Endo/Other  diabetes, Type 2Morbid obesity  Renal/GU negative Renal ROS  negative genitourinary   Musculoskeletal  (+) Arthritis ,   Abdominal   Peds  Hematology negative hematology ROS (+)   Anesthesia Other Findings Past Medical History:   Diabetes mellitus without complication (HCC)                 Hypertension                                                 Hyperlipidemia                                               Cardiac murmur                                  Diagnosed*   Arthritis                                                    GERD (gastroesophageal reflux disease)                       PVD (peripheral vascular disease) (Accident)                      Lower extremity edema                                       Past Surgical History:   TONSILLECTOMY                                    as child     DILATION AND CURETTAGE OF UTERUS                 Unsure of*   COLONOSCOPY WITH PROPOFOL                       N/A 12/25/2014     Comment:Procedure: COLONOSCOPY WITH PROPOFOL;   Surgeon:  Lollie Sails, MD;  Location: [facility]               ENDOSCOPY;  Service: Endoscopy;  Laterality:               N/A;   BREAST BIOPSY                                   Left 1990's         Comment:neg- Done in Radiology   CHOLECYSTECTOMY                                  2008         EYE SURGERY                                     Bilateral 2012           Comment:Cataract Removal- [place]   COLONOSCOPY W/ POLYPECTOMY                       07/2008         Comment:Dr. Rochel Brome   COLON SURGERY                                                BMI    Body Mass Index   44.24 kg/m 2      Reproductive/Obstetrics negative OB ROS                            Anesthesia Physical Anesthesia Plan  ASA: III  Anesthesia Plan: General ETT   Post-op Pain Management:    Induction:   Airway Management Planned:   Additional Equipment:   Intra-op Plan:   Post-operative Plan:   Informed Consent: I have reviewed the patients History and Physical, chart, labs and discussed the procedure including the risks, benefits and alternatives for the proposed anesthesia with the patient or authorized representative who has indicated his/her understanding and acceptance.   Dental Advisory Given  Plan Discussed with: Anesthesiologist, CRNA and Surgeon  Anesthesia Plan Comments:         Anesthesia Quick Evaluation

## 2015-04-11 NOTE — Anesthesia Postprocedure Evaluation (Signed)
Anesthesia Post Note  Patient: [name]  Procedure(s) Performed: Procedure(s) (LRB): LAPAROSCOPIC RIGHT COLECTOMY, REMOVAL OF RIGHT URETERAL STENT (Right) CYSTOSCOPY WITH STENT PLACEMENT (Right)  Patient location during evaluation: PACU Anesthesia Type: General Level of consciousness: awake and alert Pain management: pain level controlled Vital Signs Assessment: post-procedure vital signs reviewed and stable Respiratory status: spontaneous breathing, nonlabored ventilation, respiratory function stable and patient connected to nasal cannula oxygen Cardiovascular status: blood pressure returned to baseline and stable Postop Assessment: no signs of nausea or vomiting Anesthetic complications: no    Last Vitals:  Filed Vitals:   04/11/15 1328 04/11/15 1339  BP: 140/65 146/60  Pulse: 64 66  Temp: 36.3 C   Resp: 15 15    Last Pain:  Filed Vitals:   04/11/15 1354  PainSc: Asleep                 Carly Bell

## 2015-04-11 NOTE — Interval H&P Note (Signed)
History and Physical Interval Note:  04/11/2015 7:22 AM  Carly Bell  has presented today for surgery, with the diagnosis of BENIGN NEOPLASM OF ASCENDING COLON  The various methods of treatment have been discussed with the patient and family. After consideration of risks, benefits and other options for treatment, the patient has consented to  Procedure(s): LAPAROSCOPIC RIGHT COLECTOMY (Right) CYSTOSCOPY WITH STENT PLACEMENT (Bilateral) as a surgical intervention .  The patient's history has been reviewed, patient examined, no change in status, stable for surgery.  I have reviewed the patient's chart and labs.  Questions were answered to the patient's satisfaction.    Discussed further with Dr. Azalee Course, plan for right ureteral stent placement only.    Hollice Espy

## 2015-04-11 NOTE — Transfer of Care (Signed)
Immediate Anesthesia Transfer of Care Note  Patient: Carly Bell  Procedure(s) Performed: Procedure(s): LAPAROSCOPIC RIGHT COLECTOMY, REMOVAL OF RIGHT URETERAL STENT (Right) CYSTOSCOPY WITH STENT PLACEMENT (Right)  Patient Location: PACU  Anesthesia Type:General  Level of Consciousness: awake, alert , oriented and patient cooperative  Airway & Oxygen Therapy: Patient Spontanous Breathing and Patient connected to nasal cannula oxygen  Post-op Assessment: Report given to RN and Post -op Vital signs reviewed and stable  Post vital signs: Reviewed and stable  Last Vitals:  Filed Vitals:   04/11/15 0603 04/11/15 1228  BP: 157/60 151/67  Pulse: 101 79  Temp: 36.4 C 36.7 C  Resp: 16 13    Complications: No apparent anesthesia complications

## 2015-04-12 DIAGNOSIS — K635 Polyp of colon: Secondary | ICD-10-CM

## 2015-04-12 LAB — CBC
HEMATOCRIT: 35.8 % (ref 35.0–47.0)
HEMOGLOBIN: 11.5 g/dL — AB (ref 12.0–16.0)
MCH: 25.4 pg — AB (ref 26.0–34.0)
MCHC: 32.1 g/dL (ref 32.0–36.0)
MCV: 78.9 fL — AB (ref 80.0–100.0)
Platelets: 192 10*3/uL (ref 150–440)
RBC: 4.54 MIL/uL (ref 3.80–5.20)
RDW: 15.5 % — ABNORMAL HIGH (ref 11.5–14.5)
WBC: 15.5 10*3/uL — ABNORMAL HIGH (ref 3.6–11.0)

## 2015-04-12 LAB — BASIC METABOLIC PANEL
ANION GAP: 9 (ref 5–15)
BUN: 11 mg/dL (ref 6–20)
CHLORIDE: 100 mmol/L — AB (ref 101–111)
CO2: 21 mmol/L — AB (ref 22–32)
Calcium: 8.1 mg/dL — ABNORMAL LOW (ref 8.9–10.3)
Creatinine, Ser: 0.82 mg/dL (ref 0.44–1.00)
GFR calc Af Amer: >60 mL/min (ref >60)
GFR calc non Af Amer: >60 mL/min (ref >60)
GLUCOSE: 167 mg/dL — AB (ref 65–99)
POTASSIUM: 3.5 mmol/L (ref 3.5–5.1)
Sodium: 130 mmol/L — ABNORMAL LOW (ref 135–145)

## 2015-04-12 LAB — GLUCOSE, CAPILLARY
GLUCOSE-CAPILLARY: 152 mg/dL — AB (ref 65–99)
GLUCOSE-CAPILLARY: 110 mg/dL — AB (ref 65–99)
Glucose-Capillary: 170 mg/dL — ABNORMAL HIGH (ref 65–99)
Glucose-Capillary: 134 mg/dL — ABNORMAL HIGH (ref 65–99)

## 2015-04-12 LAB — SURGICAL PATHOLOGY

## 2015-04-12 MED ORDER — OXYCODONE HCL 5 MG PO TABS
5.0000 mg | ORAL_TABLET | ORAL | Status: DC | PRN
  Administered 2015-04-12: 10 mg via ORAL
  Filled 2015-04-12: qty 2

## 2015-04-12 NOTE — Progress Notes (Signed)
Foley removed by [facility] per order. Pt is due to void by 1615 04/12/2015. Will continue to monitor pt.   Angus Seller

## 2015-04-12 NOTE — Progress Notes (Signed)
Pt ambulated a total of 420 ft around the nursing station. Pt did very well. Will continue to encourage early ambulation.  Carly Bell

## 2015-04-12 NOTE — Progress Notes (Signed)
Pt only voided 125 ml since foley was removed at 1015 04/12/2015. RN bladder scan pt and only received 42 ml. RN relayed the message to on coming nurse.   Angus Seller

## 2015-04-12 NOTE — Progress Notes (Signed)
74yr old female POD#1 Lap Right colon resection.  Patient doing well today.  She did have some nausea yesterday but now improved.  Patient states her pain is under control for the most part, she walked twice around nurses station this AM.  She is having some pain at this moment but just took some po pain meds.   Filed Vitals:   04/12/15 0559 04/12/15 0818  BP: 127/52 125/53  Pulse: 71 71  Temp: 98.5 F (36.9 C) 98.1 F (36.7 C)  Resp: 20 15   PE:  Gen: NAD Abd: soft, appropriately tender, incisions c/d/i with glue in place Ext: 2+ pulses, no edema  CBC Latest Ref Rng 04/12/2015 04/02/2015  WBC 3.6 - 11.0 K/uL 15.5(H) 9.4  Hemoglobin 12.0 - 16.0 g/dL 11.5(L) 12.2  Hematocrit 35.0 - 47.0 % 35.8 36.7  Platelets 150 - 440 K/uL 192 190   CMP Latest Ref Rng 04/12/2015 04/02/2015  Glucose 65 - 99 mg/dL 167(H) 120(H)  BUN 6 - 20 mg/dL 11 16  Creatinine 0.44 - 1.00 mg/dL 0.82 0.58  Sodium 135 - 145 mmol/L 130(L) 142  Potassium 3.5 - 5.1 mmol/L 3.5 3.7  Chloride 101 - 111 mmol/L 100(L) 106  CO2 22 - 32 mmol/L 21(L) 28  Calcium 8.9 - 10.3 mg/dL 8.1(L) 8.9  Total Protein 6.5 - 8.1 g/dL - 7.0  Total Bilirubin 0.3 - 1.2 mg/dL - 0.6  Alkaline Phos 38 - 126 U/L - 83  AST 15 - 41 U/L - 19  ALT 14 - 54 U/L - 18    A/P:  75yr old female POD#1 Lap Right colon resection. Pain: continue scheduled tylenol, toradol, neurotin, and flexeril, continue oxycodone and morphine prn for pain Discontinue foley this AM Continue clear liquid diet until passing flatus  Encourage ambulation

## 2015-04-12 NOTE — Progress Notes (Signed)
Pt voided 50 cc urine was bloody and doctor Adonis Huguenin was notified of new findings no new orders given at this time. Will continue to monitor pt.   Angus Seller

## 2015-04-12 NOTE — Progress Notes (Signed)
Doctor Loflin gave RN verbal order that if Q4 oxycodone does not help pain, RN can increase frequency to Q3 oxycodone. Will continue to monitor pt.   Angus Seller

## 2015-04-13 LAB — GLUCOSE, CAPILLARY
GLUCOSE-CAPILLARY: 110 mg/dL — AB (ref 65–99)
GLUCOSE-CAPILLARY: 105 mg/dL — AB (ref 65–99)
GLUCOSE-CAPILLARY: 104 mg/dL — AB (ref 65–99)
Glucose-Capillary: 96 mg/dL (ref 65–99)
Glucose-Capillary: 131 mg/dL — ABNORMAL HIGH (ref 65–99)

## 2015-04-13 NOTE — Progress Notes (Signed)
2 Days Post-Op   Subjective:  Patient reports that she had a good night. She was able to void last night after removal of her Foley yesterday. She's been tolerating a clear liquid diet without any nausea. Her pain is well controlled on current regimen. She denies any flatus or bowel movement yet.  Vital signs in last 24 hours: Temp:  [98 F (36.7 C)-98.2 F (36.8 C)] 98.2 F (36.8 C) (03/18 [id number]) Pulse Rate:  [63-69] 66 (03/18 0613) Resp:  [16-18] 16 (03/18 0613) BP: (106-130)/(43-53) 117/43 mmHg (03/18 0929) SpO2:  [92 %-98 %] 92 % (03/18 0613) Last BM Date: 04/10/15  Intake/Output from previous day: 03/17 0701 - 03/18 0700 In: 3122 [P.O.:330; I.V.:2792] Out: 1075 [Urine:1075]  Physical exam: Gen.: No acute distress Chest: Clear to auscultation Heart: Regular rate and rhythm GI: Abdomen is large, soft, nondistended. Well approximated laparoscopic incision sites without evidence of erythema or drainage. Bowel sounds are present.  Lab Results:  CBC  Recent Labs  04/12/15 0320  WBC 15.5*  HGB 11.5*  HCT 35.8  PLT 192   CMP     Component Value Date/Time   NA 130* 04/12/2015 0320   K 3.5 04/12/2015 0320   CL 100* 04/12/2015 0320   CO2 21* 04/12/2015 0320   GLUCOSE 167* 04/12/2015 0320   BUN 11 04/12/2015 0320   CREATININE 0.82 04/12/2015 0320   CALCIUM 8.1* 04/12/2015 0320   PROT 7.0 04/02/2015 1334   ALBUMIN 3.9 04/02/2015 1334   AST 19 04/02/2015 1334   ALT 18 04/02/2015 1334   ALKPHOS 83 04/02/2015 1334   BILITOT 0.6 04/02/2015 1334   GFRNONAA >60 04/12/2015 0320   GFRAA >60 04/12/2015 0320   PT/INR No results for input(s): LABPROT, INR in the last 72 hours.  Studies/Results: No results found.  Assessment/Plan: 75 year old female 2 days status post laparoscopic right hemicolectomy. Doing well. Continue to encourage ambulation, incentive spirometer usage and oral intake. Await return of bowel function prior to advancement of diet.   Clayburn Pert,  MD FACS General Surgeon  04/13/2015

## 2015-04-14 LAB — BASIC METABOLIC PANEL
Anion gap: 3 — ABNORMAL LOW (ref 5–15)
BUN: 13 mg/dL (ref 6–20)
CALCIUM: 8.3 mg/dL — AB (ref 8.9–10.3)
CO2: 31 mmol/L (ref 22–32)
CREATININE: 0.83 mg/dL (ref 0.44–1.00)
Chloride: 107 mmol/L (ref 101–111)
GFR calc non Af Amer: >60 mL/min (ref >60)
Glucose, Bld: 119 mg/dL — ABNORMAL HIGH (ref 65–99)
Potassium: 3.1 mmol/L — ABNORMAL LOW (ref 3.5–5.1)
SODIUM: 141 mmol/L (ref 135–145)

## 2015-04-14 LAB — CBC
HCT: 32.9 % — ABNORMAL LOW (ref 35.0–47.0)
Hemoglobin: 10.8 g/dL — ABNORMAL LOW (ref 12.0–16.0)
MCH: 26.5 pg (ref 26.0–34.0)
MCHC: 32.9 g/dL (ref 32.0–36.0)
MCV: 80.6 fL (ref 80.0–100.0)
Platelets: 164 10*3/uL (ref 150–440)
RBC: 4.09 MIL/uL (ref 3.80–5.20)
RDW: 15.3 % — AB (ref 11.5–14.5)
WBC: 7 10*3/uL (ref 3.6–11.0)

## 2015-04-14 LAB — GLUCOSE, CAPILLARY
GLUCOSE-CAPILLARY: 97 mg/dL (ref 65–99)
Glucose-Capillary: 115 mg/dL — ABNORMAL HIGH (ref 65–99)

## 2015-04-14 LAB — MAGNESIUM: MAGNESIUM: 1.9 mg/dL (ref 1.7–2.4)

## 2015-04-14 LAB — PHOSPHORUS: PHOSPHORUS: 3.6 mg/dL (ref 2.5–4.6)

## 2015-04-14 MED ORDER — OXYCODONE HCL 5 MG PO TABS: 5.0000 mg | ORAL_TABLET | ORAL | Status: DC | PRN

## 2015-04-14 MED ORDER — CYCLOBENZAPRINE HCL 5 MG PO TABS: 5.0000 mg | ORAL_TABLET | Freq: Three times a day (TID) | ORAL | Status: DC

## 2015-04-14 MED ORDER — ONDANSETRON 4 MG PO TBDP: 4.0000 mg | ORAL_TABLET | Freq: Four times a day (QID) | ORAL | Status: DC | PRN

## 2015-04-14 NOTE — Care Management Important Message (Signed)
Important Message  Patient Details  Name: Carly Bell MRN: [id number] Date of Birth: 08/30/1940   Medicare Important Message Given:  Yes    Margert Edsall A, RN 04/14/2015, 2:23 PM

## 2015-04-14 NOTE — Discharge Summary (Signed)
Patient ID: Carly Bell MRN: [id number] DOB/AGE: 10-10-1940 75 y.o.  Admit date: 04/11/2015 Discharge date: 04/14/2015  Discharge Diagnoses:  Unresectable polyp  Procedures Performed: Laparoscopic right hemicolectomy  Discharged Condition: good  Hospital Course: 75 year old female underwent a scheduled laparoscopic right colectomy for an unresectable polyp. Patient had an uneventful surgery and uneventful recovery. She is able to ambulate, tolerating regular diet, had pain controlled on oral medications prior to discharge. Discharged home postop day 3.  Discharge Orders:  discharge home  Disposition: 01-Home or Self Care  Discharge Medications:    Medication List    TAKE these medications        aspirin 81 MG tablet  Take 81 mg by mouth daily.     aspirin-acetaminophen-caffeine [id number] MG tablet  Commonly known as:  EXCEDRIN MIGRAINE  Take by mouth every 6 (six) hours as needed for headache.     atorvastatin 80 MG tablet  Commonly known as:  LIPITOR  Take 80 mg by mouth at bedtime.     cyclobenzaprine 5 MG tablet  Commonly known as:  FLEXERIL  Take 1 tablet (5 mg total) by mouth 3 (three) times daily.     fosinopril 10 MG tablet  Commonly known as:  MONOPRIL  Take 10 mg by mouth daily.     gabapentin 100 MG capsule  Commonly known as:  NEURONTIN  Take 100 mg by mouth 3 (three) times daily.     glimepiride 4 MG tablet  Commonly known as:  AMARYL  Take 4 mg by mouth daily with breakfast.     metFORMIN 500 MG tablet  Commonly known as:  GLUCOPHAGE  Take by mouth 2 (two) times daily with a meal.     omeprazole 40 MG capsule  Commonly known as:  PRILOSEC  Take 40 mg by mouth daily.     ondansetron 4 MG disintegrating tablet  Commonly known as:  ZOFRAN-ODT  Take 1 tablet (4 mg total) by mouth every 6 (six) hours as needed for nausea.     oxyCODONE 5 MG immediate release tablet  Commonly known as:  Oxy IR/ROXICODONE  Take 1-2 tablets (5-10 mg total)  by mouth every 3 (three) hours as needed for moderate pain.     TYLENOL ARTHRITIS PAIN 650 MG CR tablet  Generic drug:  acetaminophen  Take 650 mg by mouth every 8 (eight) hours as needed for pain.     Vitamin D3 1000 units Caps  Take 2 capsules by mouth daily.         Follwup: Follow-up Information    Follow up with [facility]. Schedule an appointment as soon as possible for a visit in 10 days.   Specialty:  General Surgery   Why:  postop with Dr. Deliah Goody information:   [address] [place] [phone]      Signed: Clayburn Pert 04/14/2015, 2:44 PM

## 2015-04-14 NOTE — Final Progress Note (Signed)
3 Days Post-Op   Subjective:  Patient tolerated a regular diet and desires to go home  Vital signs in last 24 hours: Temp:  [97.9 F (36.6 C)-98.2 F (36.8 C)] 98.1 F (36.7 C) (03/19 1334) Pulse Rate:  [70-80] 80 (03/19 1334) Resp:  [18-20] 18 (03/19 1334) BP: (128-149)/(44-53) 149/44 mmHg (03/19 1334) SpO2:  [95 %-99 %] 99 % (03/19 1334) Last BM Date: 04/13/15  Intake/Output from previous day: 03/18 0701 - 03/19 0700 In: 3772.3 [P.O.:990; I.V.:2641.3; IV Piggyback:141] Out: 3500 [Urine:3500]  GI: Abdomen is large, soft, nondistended, minimally tender to palpation at incision sites. No evidence of erythema or drainage.  Lab Results:  CBC  Recent Labs  04/12/15 0320 04/14/15 0518  WBC 15.5* 7.0  HGB 11.5* 10.8*  HCT 35.8 32.9*  PLT 192 164   CMP     Component Value Date/Time   NA 141 04/14/2015 0518   K 3.1* 04/14/2015 0518   CL 107 04/14/2015 0518   CO2 31 04/14/2015 0518   GLUCOSE 119* 04/14/2015 0518   BUN 13 04/14/2015 0518   CREATININE 0.83 04/14/2015 0518   CALCIUM 8.3* 04/14/2015 0518   PROT 7.0 04/02/2015 1334   ALBUMIN 3.9 04/02/2015 1334   AST 19 04/02/2015 1334   ALT 18 04/02/2015 1334   ALKPHOS 83 04/02/2015 1334   BILITOT 0.6 04/02/2015 1334   GFRNONAA >60 04/14/2015 0518   GFRAA >60 04/14/2015 0518   PT/INR No results for input(s): LABPROT, INR in the last 72 hours.  Studies/Results: No results found.  Assessment/Plan: 75 year old female status post a laparoscopic right hemicolectomy for an unresectable polyp. Doing well. Plan to discharge home today.   Clayburn Pert, MD FACS General Surgeon  04/14/2015

## 2015-04-14 NOTE — Discharge Instructions (Signed)
Laparoscopic Colectomy, Care After °Refer to this sheet in the next few weeks. These instructions provide you with information on caring for yourself after your procedure. Your health care provider may also give you more specific instructions. Your treatment has been planned according to current medical practices, but problems sometimes occur. Call your health care provider if you have any problems or questions after your procedure. °WHAT TO EXPECT AFTER THE PROCEDURE °After your procedure, it is typical to have the following: °· Pain in your abdomen, especially at the incision sites. You will be given pain medicine to control the pain. °· Tiredness. This is a normal part of the recovery process. Your energy level will return to normal over the next several weeks. °· Constipation. You may be given stool softeners to prevent this. °HOME CARE INSTRUCTIONS  °· Only take over-the-counter or prescription medicines as directed by your health care provider. °· Ask your health care provider whether you may take a shower when you go home. °· Remove or change any bandages (dressings) as directed. °· You may resume a normal diet and activities as directed. Eat plenty of fruits and vegetables to help prevent constipation. °· Drink enough fluids to keep your urine clear or pale yellow. This also helps prevent constipation. °· Take rest breaks during the day as needed. °· Avoid lifting anything heavier than 25 pounds (11.3 kg) or driving for 4 weeks or until your health care provider says it is okay. °· Follow up with your health care provider as directed. Ask your health care provider when to make an appointment to get your stitches or staples removed. °SEEK MEDICAL CARE IF:  °· You have increased bleeding from the incision areas. °· You have redness, swelling, or increasing pain in the wounds. °· You see pus coming from a wound. °· You have a fever. °· You notice a foul smell coming from the wound or dressing. °· Your wound is  breaking open (edges not staying together) after sutures or staples have been removed. °SEEK IMMEDIATE MEDICAL CARE IF: °· You develop a rash. °· You have chest pain or difficulty breathing. °· You have pain or swelling in your legs. °· You have lightheadedness or feel faint. °· Your abdomen becomes larger (distended). °· You have nausea or vomiting. °· You have blood in your stools. °  °This information is not intended to replace advice given to you by your health care provider. Make sure you discuss any questions you have with your health care provider. °  °Document Released: 08/01/2004 Document Revised: 11/02/2012 Document Reviewed: 08/24/2012 °Elsevier Interactive Patient Education ©2016 [employer] ° °

## 2015-04-14 NOTE — Progress Notes (Signed)
3 Days Post-Op   Subjective:  Patient reports that she had bowel movement overnight and desires regular food. She is feeling good  Vital signs in last 24 hours: Temp:  [97.9 F (36.6 C)-98.2 F (36.8 C)] 97.9 F (36.6 C) (03/19 0529) Pulse Rate:  [69-74] 70 (03/19 0928) Resp:  [18-20] 20 (03/19 0529) BP: (119-141)/(41-53) 141/53 mmHg (03/19 0934) SpO2:  [95 %-99 %] 99 % (03/19 0928) Last BM Date: 04/13/15  Intake/Output from previous day: 03/18 0701 - 03/19 0700 In: 3772.3 [P.O.:990; I.V.:2641.3; IV Piggyback:141] Out: 3500 [Urine:3500]  GI: Large, soft, nondistended. Appropriately tender to palpation at incision sites without evidence of erythema or drainage.  Lab Results:  CBC  Recent Labs  04/12/15 0320 04/14/15 0518  WBC 15.5* 7.0  HGB 11.5* 10.8*  HCT 35.8 32.9*  PLT 192 164   CMP     Component Value Date/Time   NA 130* 04/12/2015 0320   K 3.5 04/12/2015 0320   CL 100* 04/12/2015 0320   CO2 21* 04/12/2015 0320   GLUCOSE 167* 04/12/2015 0320   BUN 11 04/12/2015 0320   CREATININE 0.82 04/12/2015 0320   CALCIUM 8.1* 04/12/2015 0320   PROT 7.0 04/02/2015 1334   ALBUMIN 3.9 04/02/2015 1334   AST 19 04/02/2015 1334   ALT 18 04/02/2015 1334   ALKPHOS 83 04/02/2015 1334   BILITOT 0.6 04/02/2015 1334   GFRNONAA >60 04/12/2015 0320   GFRAA >60 04/12/2015 0320   PT/INR No results for input(s): LABPROT, INR in the last 72 hours.  Studies/Results: No results found.  Assessment/Plan: 75 year old female 3 days status post lap scopic right colectomy. Doing well. Had bowel function overnight and will advance diet to regular this morning. If tolerates regular diet possible discharge home later today.   Clayburn Pert, MD FACS General Surgeon  04/14/2015

## 2015-04-25 ENCOUNTER — Encounter: Payer: Self-pay | Admitting: Surgery

## 2015-04-25 ENCOUNTER — Ambulatory Visit (INDEPENDENT_AMBULATORY_CARE_PROVIDER_SITE_OTHER): Payer: PPO | Admitting: Surgery

## 2015-04-25 VITALS — BP 148/84 | HR 77 | Temp 98.1°F | Ht 66.0 in | Wt 268.0 lb

## 2015-04-25 DIAGNOSIS — K635 Polyp of colon: Secondary | ICD-10-CM

## 2015-04-25 NOTE — Patient Instructions (Signed)
Please call with any concerns or issues.  You will need to follow up with Dr. Donnella Sham in 1 year to have another colonoscopy.

## 2015-04-25 NOTE — Progress Notes (Signed)
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 The pathology was negative for malignancy. Patient states that she's doing well she is up and moving around well. Patient states that she does not have any pain. Patient states that she is having normal bowel movements. Patient states that her appetite has been decreased some but is improving now.  Filed Vitals:   04/25/15 1428  BP: 148/84  Pulse: 77  Temp: 98.1 F (36.7 C)   PE:  Gen: NAD Abd: soft, non tender, incisions c/d/i  A/P:  Patient is healing well after laparoscopic colectomy, I discussed the pathology with the patient and assured her that this was not a cancerous lesion. I did discuss with her that she will need close follow-up with GI and another colonoscopy at that time.  She can follow-up with any questions or concerns.

## 2015-09-20 ENCOUNTER — Other Ambulatory Visit: Payer: Self-pay | Admitting: Internal Medicine

## 2015-09-20 DIAGNOSIS — Z1231 Encounter for screening mammogram for malignant neoplasm of breast: Secondary | ICD-10-CM

## 2015-10-28 ENCOUNTER — Ambulatory Visit: Payer: PPO

## 2015-11-21 ENCOUNTER — Ambulatory Visit
Admission: RE | Admit: 2015-11-21 | Discharge: 2015-11-21 | Disposition: A | Discharge status: Home / Self Care | Payer: Medicare HMO | Source: Ambulatory Visit | Attending: Internal Medicine | Admitting: Internal Medicine

## 2015-11-21 DIAGNOSIS — Z1231 Encounter for screening mammogram for malignant neoplasm of breast: Secondary | ICD-10-CM | POA: Diagnosis present

## 2015-12-09 ENCOUNTER — Emergency Department
Admission: EM | Admit: 2015-12-09 | Discharge: 2015-12-09 | Disposition: A | Discharge status: Home / Self Care | Payer: PPO | Attending: Student in an Organized Health Care Education/Training Program | Admitting: Student in an Organized Health Care Education/Training Program

## 2015-12-09 ENCOUNTER — Emergency Department: Payer: PPO

## 2015-12-09 ENCOUNTER — Encounter: Payer: Self-pay | Admitting: Radiology

## 2015-12-09 DIAGNOSIS — Z79899 Other long term (current) drug therapy: Secondary | ICD-10-CM | POA: Diagnosis not present

## 2015-12-09 DIAGNOSIS — I10 Essential (primary) hypertension: Secondary | ICD-10-CM | POA: Insufficient documentation

## 2015-12-09 DIAGNOSIS — Z7984 Long term (current) use of oral hypoglycemic drugs: Secondary | ICD-10-CM | POA: Diagnosis not present

## 2015-12-09 DIAGNOSIS — R1031 Right lower quadrant pain: Secondary | ICD-10-CM | POA: Diagnosis not present

## 2015-12-09 DIAGNOSIS — N201 Calculus of ureter: Secondary | ICD-10-CM

## 2015-12-09 DIAGNOSIS — E119 Type 2 diabetes mellitus without complications: Secondary | ICD-10-CM | POA: Diagnosis not present

## 2015-12-09 DIAGNOSIS — Z7982 Long term (current) use of aspirin: Secondary | ICD-10-CM | POA: Insufficient documentation

## 2015-12-09 LAB — CBC
HEMATOCRIT: 39.1 % (ref 35.0–47.0)
HEMOGLOBIN: 12.8 g/dL (ref 12.0–16.0)
MCH: 26 pg (ref 26.0–34.0)
MCHC: 32.7 g/dL (ref 32.0–36.0)
MCV: 79.6 fL — ABNORMAL LOW (ref 80.0–100.0)
Platelets: 219 10*3/uL (ref 150–440)
RBC: 4.92 MIL/uL (ref 3.80–5.20)
RDW: 16.3 % — AB (ref 11.5–14.5)
WBC: 13.2 10*3/uL — AB (ref 3.6–11.0)

## 2015-12-09 LAB — COMPREHENSIVE METABOLIC PANEL
ALT: 16 U/L (ref 14–54)
ANION GAP: 9 (ref 5–15)
AST: 23 U/L (ref 15–41)
Albumin: 4 g/dL (ref 3.5–5.0)
Alkaline Phosphatase: 68 U/L (ref 38–126)
BILIRUBIN TOTAL: 0.9 mg/dL (ref 0.3–1.2)
BUN: 23 mg/dL — ABNORMAL HIGH (ref 6–20)
CO2: 24 mmol/L (ref 22–32)
Calcium: 9 mg/dL (ref 8.9–10.3)
Chloride: 101 mmol/L (ref 101–111)
Creatinine, Ser: 0.89 mg/dL (ref 0.44–1.00)
Glucose, Bld: 172 mg/dL — ABNORMAL HIGH (ref 65–99)
POTASSIUM: 3.9 mmol/L (ref 3.5–5.1)
Sodium: 134 mmol/L — ABNORMAL LOW (ref 135–145)
TOTAL PROTEIN: 7.3 g/dL (ref 6.5–8.1)

## 2015-12-09 LAB — URINALYSIS COMPLETE WITH MICROSCOPIC (ARMC ONLY)
BACTERIA UA: NONE SEEN
Bilirubin Urine: NEGATIVE
Glucose, UA: 50 mg/dL — AB
NITRITE: NEGATIVE
PROTEIN: 30 mg/dL — AB
SPECIFIC GRAVITY, URINE: 1.024 (ref 1.005–1.030)
pH: 5 (ref 5.0–8.0)

## 2015-12-09 LAB — LIPASE, BLOOD: Lipase: 17 U/L (ref 11–51)

## 2015-12-09 MED ORDER — IOPAMIDOL (ISOVUE-300) INJECTION 61%: 30.0000 mL | Freq: Once | INTRAVENOUS | Status: DC | PRN

## 2015-12-09 MED ORDER — FENTANYL CITRATE (PF) 100 MCG/2ML IJ SOLN
50.0000 ug | INTRAMUSCULAR | Status: DC | PRN
  Administered 2015-12-09: 50 ug via INTRAVENOUS
  Filled 2015-12-09: qty 2

## 2015-12-09 MED ORDER — PROMETHAZINE HCL 12.5 MG PO TABS
12.5000 mg | ORAL_TABLET | Freq: Four times a day (QID) | ORAL | 0 refills | Status: DC | PRN
Start: 2015-12-09 — End: 2019-03-27

## 2015-12-09 MED ORDER — IOPAMIDOL (ISOVUE-300) INJECTION 61%
100.0000 mL | Freq: Once | INTRAVENOUS | Status: AC | PRN
  Administered 2015-12-09: 100 mL via INTRAVENOUS

## 2015-12-09 MED ORDER — TAMSULOSIN HCL 0.4 MG PO CAPS: 0.4000 mg | ORAL_CAPSULE | Freq: Every day | ORAL | 0 refills | Status: DC

## 2015-12-09 MED ORDER — PROMETHAZINE HCL 25 MG/ML IJ SOLN: 12.5000 mg | Freq: Once | INTRAMUSCULAR | Status: DC

## 2015-12-09 MED ORDER — HYDROCODONE-ACETAMINOPHEN 5-325 MG PO TABS: 1.0000 | ORAL_TABLET | ORAL | 0 refills | Status: DC | PRN

## 2015-12-09 NOTE — ED Notes (Signed)
Pt to  17H   RN placing pt in bed reports that no protocols have been completed   Results are in computer

## 2015-12-09 NOTE — ED Triage Notes (Signed)
Pt reports last night started with some pain to her right lower abdomen and some nausea. Pt reports she kept getting up to go to the bathroom because it felt like she had to urinate she was able to urinate but a small amount. Pt denies dysuria. Pt reports the feeling of the need to urinate is common for her.

## 2015-12-09 NOTE — ED Notes (Signed)
This RN triaged under a log in for an emergency Tech.

## 2015-12-09 NOTE — ED Provider Notes (Signed)
[facility] Emergency Department Provider Note    First MD Initiated Contact with Patient 12/09/15 1228     (approximate)  I have reviewed the triage vital signs and the nursing notes.   HISTORY  Chief Complaint Abdominal Pain and Nausea    HPI Carly Bell is a 75 y.o. female who presents with acute right lower quadrant abdominal pain as well as nausea that started last night and has kept her from sleeping over night. Patient thought that it was signed that she needed to urinate. Went to the bathroom multiple times overnight but had no relief in symptoms.  States the pain has persisted and currently rates it as an 8 out of 10 in severity. Denies any radiation of the pain. Describes it as a constant aching throbbing sensation in her right lower quadrant. Has never had this kind of pain before. Has had previous surgery with the partial colectomy due to reported "colon polyps".  She is not on any blood thinners.   Past Medical History:  Diagnosis Date  . Arthritis   . Cardiac murmur Diagnosed in 2007  . Diabetes mellitus without complication ([place])   . GERD (gastroesophageal reflux disease)   . Hyperlipidemia   . Hypertension   . Lower extremity edema   . PVD (peripheral vascular disease) (HCC)    Family History  Problem Relation Age of Onset  . Prostate cancer Father   . Alzheimer's disease Mother   . Diabetes Sister   . Diabetes Brother    Past Surgical History:  Procedure Laterality Date  . BREAST BIOPSY Left 1990's   neg- Done in Radiology  . CHOLECYSTECTOMY  2008  . COLON SURGERY    . COLONOSCOPY W/ POLYPECTOMY  07/2008   Dr. Rochel Brome  . COLONOSCOPY WITH PROPOFOL N/A 12/25/2014   Procedure: COLONOSCOPY WITH PROPOFOL;  Surgeon: Lollie Sails, MD;  Location[facility] ENDOSCOPY;  Service: Endoscopy;  Laterality: N/A;  . CYSTOSCOPY WITH STENT PLACEMENT Right 04/11/2015   Procedure: CYSTOSCOPY WITH STENT PLACEMENT;  Surgeon: Hollice Espy,  MD;  Location: [facility] ORS;  Service: Urology;  Laterality: Right;  . DILATION AND CURETTAGE OF UTERUS  Unsure of Date  . EYE SURGERY Bilateral 2012   Cataract Removal- [facility]  . LAPAROSCOPIC RIGHT COLECTOMY Right 04/11/2015   Procedure: LAPAROSCOPIC RIGHT COLECTOMY, REMOVAL OF RIGHT URETERAL STENT;  Surgeon: Hubbard Hadassa Cermak, MD;  Location: [facility] ORS;  Service: General;  Laterality: Right;  . TONSILLECTOMY  as child   Patient Active Problem List   Diagnosis Date Noted  . Degeneration of intervertebral disc of lumbar region 10/18/2014  . Lumbar canal stenosis 04/19/2014  . Bursitis, trochanteric 04/19/2014  . Morbid obesity ([place]) 11/04/2013  . Type 2 diabetes mellitus with other diabetic neurological complication [id number]  . Generalized OA 07/08/2013  . Acid reflux 07/08/2013  . Benign hypertension 07/08/2013  . HLD (hyperlipidemia) 07/08/2013      Prior to Admission medications   Medication Sig Start Date End Date Taking? Authorizing Provider  acetaminophen (TYLENOL ARTHRITIS PAIN) 650 MG CR tablet Take 650 mg by mouth every 8 (eight) hours as needed for pain.    Historical Provider, MD  aspirin 81 MG tablet Take 81 mg by mouth daily.    Historical Provider, MD  aspirin-acetaminophen-caffeine (EXCEDRIN MIGRAINE) [phone] MG tablet Take by mouth every 6 (six) hours as needed for headache.    Historical Provider, MD  atorvastatin (LIPITOR) 80 MG tablet Take 80 mg by mouth  at bedtime.     Historical Provider, MD  Cholecalciferol (VITAMIN D3) 1000 units CAPS Take 2 capsules by mouth daily.    Historical Provider, MD  cyclobenzaprine (FLEXERIL) 5 MG tablet Take 1 tablet (5 mg total) by mouth 3 (three) times daily. 04/14/15   Clayburn Pert, MD  fosinopril (MONOPRIL) 10 MG tablet Take 10 mg by mouth daily.    Historical Provider, MD  gabapentin (NEURONTIN) 100 MG capsule Take 100 mg by mouth 3 (three) times daily.    Historical Provider, MD  glimepiride (AMARYL) 4 MG  tablet Take 4 mg by mouth daily with breakfast.    Historical Provider, MD  metFORMIN (GLUCOPHAGE) 500 MG tablet Take by mouth 2 (two) times daily with a meal.    Historical Provider, MD  omeprazole (PRILOSEC) 40 MG capsule Take 40 mg by mouth daily.    Historical Provider, MD    Allergies Codeine and Vicodin [hydrocodone-acetaminophen]    Social History Social History  Substance Use Topics  . Smoking status: Never Smoker  . Smokeless tobacco: Never Used  . Alcohol use No    Review of Systems Patient denies headaches, rhinorrhea, blurry vision, numbness, shortness of breath, chest pain, edema, cough, abdominal pain, nausea, vomiting, diarrhea, dysuria, fevers, rashes or hallucinations unless otherwise stated above in HPI. ____________________________________________   PHYSICAL EXAM:  VITAL SIGNS: Vitals:   12/09/15 0956  BP: (!) 139/51  Pulse: 78  Resp: 16  Temp: 97.8 F (36.6 C)    Constitutional: Alert and oriented. in no acute distress. Eyes: Conjunctivae are normal. PERRL. EOMI. Head: Atraumatic. Nose: No congestion/rhinnorhea. Mouth/Throat: Mucous membranes are moist.  Oropharynx non-erythematous. Neck: No stridor. Painless ROM. No cervical spine tenderness to palpation Hematological/Lymphatic/Immunilogical: No cervical lymphadenopathy. Cardiovascular: Normal rate, regular rhythm. Grossly normal heart sounds.  Good peripheral circulation. Respiratory: Normal respiratory effort.  No retractions. Lungs CTAB. Gastrointestinal: obese with ttp in RLQ with rebound tenderness, no hernias. No distention. No abdominal bruits. No CVA tenderness. Musculoskeletal: No lower extremity tenderness nor edema.  No joint effusions. Neurologic:  Normal speech and language. No gross focal neurologic deficits are appreciated. No gait instability. Skin:  Skin is warm, dry and intact. No rash noted. Psychiatric: Mood and affect are normal. Speech and behavior are  normal. ____________________________________________   LABS (all labs ordered are listed, but only abnormal results are displayed)  Results for orders placed or performed during the hospital encounter of 12/09/15 (from the past 24 hour(s))  Lipase, blood     Status: None   Collection Time: 12/09/15  9:55 AM  Result Value Ref Range   Lipase 17 11 - 51 U/L  Comprehensive metabolic panel     Status: Abnormal   Collection Time: 12/09/15  9:55 AM  Result Value Ref Range   Sodium 134 (L) 135 - 145 mmol/L   Potassium 3.9 3.5 - 5.1 mmol/L   Chloride 101 101 - 111 mmol/L   CO2 24 22 - 32 mmol/L   Glucose, Bld 172 (H) 65 - 99 mg/dL   BUN 23 (H) 6 - 20 mg/dL   Creatinine, Ser 0.89 0.44 - 1.00 mg/dL   Calcium 9.0 8.9 - 10.3 mg/dL   Total Protein 7.3 6.5 - 8.1 g/dL   Albumin 4.0 3.5 - 5.0 g/dL   AST 23 15 - 41 U/L   ALT 16 14 - 54 U/L   Alkaline Phosphatase 68 38 - 126 U/L   Total Bilirubin 0.9 0.3 - 1.2 mg/dL   GFR calc non  Af Amer >60 >60 mL/min   GFR calc Af Amer >60 >60 mL/min   Anion gap 9 5 - 15  CBC     Status: Abnormal   Collection Time: 12/09/15  9:55 AM  Result Value Ref Range   WBC 13.2 (H) 3.6 - 11.0 K/uL   RBC 4.92 3.80 - 5.20 MIL/uL   Hemoglobin 12.8 12.0 - 16.0 g/dL   HCT 39.1 35.0 - 47.0 %   MCV 79.6 (L) 80.0 - 100.0 fL   MCH 26.0 26.0 - 34.0 pg   MCHC 32.7 32.0 - 36.0 g/dL   RDW 16.3 (H) 11.5 - 14.5 %   Platelets 219 150 - 440 K/uL  Urinalysis complete, with microscopic     Status: Abnormal   Collection Time: 12/09/15 10:11 AM  Result Value Ref Range   Color, Urine YELLOW (A) YELLOW   APPearance CLEAR (A) CLEAR   Glucose, UA 50 (A) NEGATIVE mg/dL   Bilirubin Urine NEGATIVE NEGATIVE   Ketones, ur 1+ (A) NEGATIVE mg/dL   Specific Gravity, Urine 1.024 1.005 - 1.030   Hgb urine dipstick 2+ (A) NEGATIVE   pH 5.0 5.0 - 8.0   Protein, ur 30 (A) NEGATIVE mg/dL   Nitrite NEGATIVE NEGATIVE   Leukocytes, UA 1+ (A) NEGATIVE   RBC / HPF 6-30 0 - 5 RBC/hpf   WBC, UA  6-30 0 - 5 WBC/hpf   Bacteria, UA NONE SEEN NONE SEEN   Squamous Epithelial / LPF 0-5 (A) NONE SEEN   Mucous PRESENT    Hyaline Casts, UA PRESENT    ____________________________________________  ____________________________________________  RADIOLOGY  I personally reviewed all radiographic images ordered to evaluate for the above acute complaints and reviewed radiology reports and findings.  These findings were personally discussed with the patient.  Please see medical record for radiology report.   ____________________________________________   PROCEDURES  Procedure(s) performed: none Procedures    Critical Care performed: no ____________________________________________   INITIAL IMPRESSION / ASSESSMENT AND PLAN / ED COURSE  Pertinent labs & imaging results that were available during my care of the patient were reviewed by me and considered in my medical decision making (see chart for details).  DDX: appy, colitis, sbo, dverticulitis, uti, stone, hernia  ZINEB ZUNK is a 75 y.o. who presents to the ED with acute right lower quadrant pain. Patient is AFVSS in ED. Exam as above. Given current presentation have considered the above differential.  CT imaging ordered due to concern for acute appendicitis versus colitis.  The patient will be placed on continuous pulse oximetry and telemetry for monitoring.  Laboratory evaluation will be sent to evaluate for the above complaints.     Clinical Course as of Dec 08 1624  Mon Dec 09, 2015  1615 Hemoglobin: 12.8 [PR]  1620 CT imaging shows evidence of small right ureteral 4 mm stone with surrounding pelviectasis.  Urine is without evidence of infection. Mild acidosis likely secondary to pain. Patient's pain is improved in the ER. She has normal renal function. Patient appropriate for outpatient follow-up.  [PR]    Clinical Course User Index [PR] Merlyn Lot, MD     ____________________________________________   FINAL  CLINICAL IMPRESSION(S) / ED DIAGNOSES  Final diagnoses:  Ureterolithiasis  RLQ abdominal pain      NEW MEDICATIONS STARTED DURING THIS VISIT:  New Prescriptions   No medications on file     Note:  This document was prepared using Dragon voice recognition software and may include unintentional dictation  errors.    Merlyn Lot, MD 12/09/15 2117

## 2015-12-09 NOTE — ED Notes (Signed)
Patient transported to CT 

## 2015-12-09 NOTE — ED Notes (Signed)
Helped pt to bathroom. 

## 2015-12-25 DIAGNOSIS — M1612 Unilateral primary osteoarthritis, left hip: Secondary | ICD-10-CM | POA: Diagnosis not present

## 2015-12-25 DIAGNOSIS — M5416 Radiculopathy, lumbar region: Secondary | ICD-10-CM | POA: Diagnosis not present

## 2015-12-25 DIAGNOSIS — M5136 Other intervertebral disc degeneration, lumbar region: Secondary | ICD-10-CM | POA: Diagnosis not present

## 2015-12-25 DIAGNOSIS — M48062 Spinal stenosis, lumbar region with neurogenic claudication: Secondary | ICD-10-CM | POA: Diagnosis not present

## 2016-01-22 DIAGNOSIS — M1612 Unilateral primary osteoarthritis, left hip: Secondary | ICD-10-CM | POA: Diagnosis not present

## 2016-10-26 ENCOUNTER — Encounter: Payer: Self-pay | Admitting: *Deleted

## 2016-10-27 ENCOUNTER — Ambulatory Visit: Payer: Medicare PPO | Admitting: Anesthesiology

## 2016-10-27 ENCOUNTER — Ambulatory Visit
Admission: RE | Admit: 2016-10-27 | Discharge: 2016-10-27 | Disposition: A | Discharge status: Home / Self Care | Payer: Medicare PPO | Source: Ambulatory Visit | Attending: Gastroenterology | Admitting: Gastroenterology

## 2016-10-27 ENCOUNTER — Encounter
Admission: RE | Disposition: A | Discharge status: Home / Self Care | Payer: Self-pay | Source: Ambulatory Visit | Attending: Gastroenterology

## 2016-10-27 DIAGNOSIS — R011 Cardiac murmur, unspecified: Secondary | ICD-10-CM | POA: Diagnosis not present

## 2016-10-27 DIAGNOSIS — Z6841 Body Mass Index (BMI) 40.0 and over, adult: Secondary | ICD-10-CM | POA: Insufficient documentation

## 2016-10-27 DIAGNOSIS — K621 Rectal polyp: Secondary | ICD-10-CM | POA: Insufficient documentation

## 2016-10-27 DIAGNOSIS — Z980 Intestinal bypass and anastomosis status: Secondary | ICD-10-CM | POA: Insufficient documentation

## 2016-10-27 DIAGNOSIS — D123 Benign neoplasm of transverse colon: Secondary | ICD-10-CM | POA: Insufficient documentation

## 2016-10-27 DIAGNOSIS — Z86010 Personal history of colonic polyps: Secondary | ICD-10-CM | POA: Diagnosis present

## 2016-10-27 DIAGNOSIS — Z79899 Other long term (current) drug therapy: Secondary | ICD-10-CM | POA: Diagnosis not present

## 2016-10-27 DIAGNOSIS — E1151 Type 2 diabetes mellitus with diabetic peripheral angiopathy without gangrene: Secondary | ICD-10-CM | POA: Insufficient documentation

## 2016-10-27 DIAGNOSIS — E785 Hyperlipidemia, unspecified: Secondary | ICD-10-CM | POA: Insufficient documentation

## 2016-10-27 DIAGNOSIS — M1990 Unspecified osteoarthritis, unspecified site: Secondary | ICD-10-CM | POA: Diagnosis not present

## 2016-10-27 DIAGNOSIS — Z7984 Long term (current) use of oral hypoglycemic drugs: Secondary | ICD-10-CM | POA: Insufficient documentation

## 2016-10-27 DIAGNOSIS — K5730 Diverticulosis of large intestine without perforation or abscess without bleeding: Secondary | ICD-10-CM | POA: Insufficient documentation

## 2016-10-27 DIAGNOSIS — Z7982 Long term (current) use of aspirin: Secondary | ICD-10-CM | POA: Diagnosis not present

## 2016-10-27 DIAGNOSIS — Z885 Allergy status to narcotic agent status: Secondary | ICD-10-CM | POA: Diagnosis not present

## 2016-10-27 DIAGNOSIS — K219 Gastro-esophageal reflux disease without esophagitis: Secondary | ICD-10-CM | POA: Insufficient documentation

## 2016-10-27 DIAGNOSIS — I10 Essential (primary) hypertension: Secondary | ICD-10-CM | POA: Insufficient documentation

## 2016-10-27 HISTORY — PX: COLONOSCOPY WITH PROPOFOL: SHX5780

## 2016-10-27 HISTORY — DX: Diverticulosis of intestine, part unspecified, without perforation or abscess without bleeding: K57.90

## 2016-10-27 LAB — GLUCOSE, CAPILLARY: GLUCOSE-CAPILLARY: 127 mg/dL — AB (ref 65–99)

## 2016-10-27 SURGERY — COLONOSCOPY WITH PROPOFOL
Anesthesia: General

## 2016-10-27 MED ORDER — PROPOFOL 10 MG/ML IV BOLUS
INTRAVENOUS | Status: DC | PRN
  Administered 2016-10-27: 100 mg via INTRAVENOUS

## 2016-10-27 MED ORDER — PROPOFOL 10 MG/ML IV BOLUS
INTRAVENOUS | Status: AC
  Filled 2016-10-27: qty 20

## 2016-10-27 MED ORDER — EPHEDRINE SULFATE 50 MG/ML IJ SOLN
INTRAMUSCULAR | Status: AC
  Filled 2016-10-27: qty 1

## 2016-10-27 MED ORDER — FENTANYL CITRATE (PF) 100 MCG/2ML IJ SOLN
INTRAMUSCULAR | Status: DC | PRN
  Administered 2016-10-27: 50 ug via INTRAVENOUS

## 2016-10-27 MED ORDER — PROPOFOL 500 MG/50ML IV EMUL
INTRAVENOUS | Status: DC | PRN
  Administered 2016-10-27: 160 ug/kg/min via INTRAVENOUS

## 2016-10-27 MED ORDER — PROPOFOL 500 MG/50ML IV EMUL
INTRAVENOUS | Status: AC
  Filled 2016-10-27: qty 50

## 2016-10-27 MED ORDER — FENTANYL CITRATE (PF) 100 MCG/2ML IJ SOLN
INTRAMUSCULAR | Status: AC
  Filled 2016-10-27: qty 2

## 2016-10-27 MED ORDER — PHENYLEPHRINE HCL 10 MG/ML IJ SOLN
INTRAMUSCULAR | Status: DC | PRN
  Administered 2016-10-27 (×2): 100 ug via INTRAVENOUS

## 2016-10-27 MED ORDER — SODIUM CHLORIDE 0.9 % IV SOLN
INTRAVENOUS | Status: DC
  Administered 2016-10-27: 1000 mL via INTRAVENOUS
  Administered 2016-10-27: 09:00:00 via INTRAVENOUS

## 2016-10-27 MED ORDER — LIDOCAINE HCL (PF) 2 % IJ SOLN
INTRAMUSCULAR | Status: AC
  Filled 2016-10-27: qty 2

## 2016-10-27 MED ORDER — SODIUM CHLORIDE 0.9 % IV SOLN: INTRAVENOUS | Status: DC

## 2016-10-27 MED ORDER — MIDAZOLAM HCL 2 MG/2ML IJ SOLN
INTRAMUSCULAR | Status: AC
  Filled 2016-10-27: qty 2

## 2016-10-27 MED ORDER — LIDOCAINE 2% (20 MG/ML) 5 ML SYRINGE
INTRAMUSCULAR | Status: DC | PRN
  Administered 2016-10-27: 40 mg via INTRAVENOUS

## 2016-10-27 NOTE — Anesthesia Postprocedure Evaluation (Signed)
Anesthesia Post Note  Patient: Carly Bell  Procedure(s) Performed: COLONOSCOPY WITH PROPOFOL (N/A )  Patient location during evaluation: PACU Anesthesia Type: General Level of consciousness: awake and alert Pain management: pain level controlled Vital Signs Assessment: post-procedure vital signs reviewed and stable Respiratory status: spontaneous breathing, nonlabored ventilation, respiratory function stable and patient connected to nasal cannula oxygen Cardiovascular status: blood pressure returned to baseline and stable Postop Assessment: no apparent nausea or vomiting Anesthetic complications: no     Last Vitals:  Vitals:   10/27/16 1011 10/27/16 1021  BP: 127/66 122/81  Pulse: 67 63  Resp: (!) 21 18  Temp:    SpO2: 99% 98%    Last Pain:  Vitals:   10/27/16 0951  TempSrc: Tympanic                 Molli Barrows

## 2016-10-27 NOTE — Transfer of Care (Signed)
Immediate Anesthesia Transfer of Care Note  Patient: Carly Bell  Procedure(s) Performed: COLONOSCOPY WITH PROPOFOL (N/A )  Patient Location: PACU and Endoscopy Unit  Anesthesia Type:General  Level of Consciousness: awake and patient cooperative  Airway & Oxygen Therapy: Patient Spontanous Breathing and Patient connected to nasal cannula oxygen  Post-op Assessment: Report given to RN and Post -op Vital signs reviewed and stable  Post vital signs: Reviewed and stable  Last Vitals:  Vitals:   10/27/16 0816 10/27/16 0951  BP: (!) 169/58 (!) 94/43  Pulse: 72 82  Resp: 16 16  Temp: 36.7 C (!) 36.3 C  SpO2: 99% 98%    Last Pain:  Vitals:   10/27/16 0951  TempSrc: Tympanic         Complications: No apparent anesthesia complications

## 2016-10-27 NOTE — Op Note (Signed)
[facility] Gastroenterology Patient Name: Carly Bell Procedure Date: 10/27/2016 8:58 AM MRN: [id number] Account #: [id number] Date of Birth: 03/12/1940 Admit Type: Outpatient Age: 76 Room: [facility] ENDO ROOM 3 Gender: Female Note Status: Finalized Procedure:            Colonoscopy Indications:          Personal history of colonic polyps Providers:            Lollie Sails, MD Referring MD:         Ocie Cornfield. Ouida Sills MD, MD (Referring MD) Medicines:            Monitored Anesthesia Care Complications:        No immediate complications. Procedure:            Pre-Anesthesia Assessment:                       - ASA Grade Assessment: III - A patient with severe                        systemic disease.                       After obtaining informed consent, the colonoscope was                        passed under direct vision. Throughout the procedure,                        the patient's blood pressure, pulse, and oxygen                        saturations were monitored continuously. The                        Colonoscope was introduced through the anus and                        advanced to the the ileocolonic anastomosis. The                        colonoscopy was performed without difficulty. The                        patient tolerated the procedure well. The quality of                        the bowel preparation was good. Findings:      A 7 mm polyp was found in the splenic flexure. The polyp was sessile.       The polyp was removed with a cold biopsy forceps. Resection and       retrieval were complete.      There was evidence of a prior functional end-to-end ileo-colonic       anastomosis in the distal ascending colon. This was patent and was       characterized by healthy appearing mucosa.      A 2 mm polyp was found in the rectum. The polyp was sessile. The polyp       was removed with a cold biopsy forceps. Resection and retrieval were        complete.      A  tattoo was seen in the distal rectum. A post-polypectomy scar was       found at the tattoo site. There was no evidence of residual polyp tissue.      The digital rectal exam was normal.      Multiple small to medium-mouthed diverticula were found in the sigmoid       colon and descending colon. Impression:           - One 7 mm polyp at the splenic flexure, removed with a                        cold biopsy forceps. Resected and retrieved.                       - Patent functional end-to-end ileo-colonic                        anastomosis, characterized by healthy appearing mucosa.                       - One 2 mm polyp in the rectum, removed with a cold                        biopsy forceps. Resected and retrieved.                       - A tattoo was seen in the distal rectum. A                        post-polypectomy scar was found at the tattoo site.                        There was no evidence of residual polyp tissue.                       - Diverticulosis in the sigmoid colon and in the                        descending colon. Recommendation:       - Discharge patient to home.                       - Await pathology results.                       - Telephone GI clinic for pathology results in 1 week. Procedure Code(s):    --- Professional ---                       [id number], Colonoscopy, flexible; with biopsy, single or                        multiple Diagnosis Code(s):    --- Professional ---                       D12.3, Benign neoplasm of transverse colon (hepatic                        flexure or splenic flexure)  K62.1, Rectal polyp                       Z98.0, Intestinal bypass and anastomosis status                       Z86.010, Personal history of colonic polyps                       K57.30, Diverticulosis of large intestine without                        perforation or abscess without bleeding CPT copyright 2016 American Medical  Association. All rights reserved. The codes documented in this report are preliminary and upon coder review may  be revised to meet current compliance requirements. Lollie Sails, MD 10/27/2016 9:51:43 AM This report has been signed electronically. Number of Addenda: 0 Note Initiated On: 10/27/2016 8:58 AM Scope Withdrawal Time: 0 hours 20 minutes 24 seconds  Total Procedure Duration: 0 hours 29 minutes 23 seconds       [facility]

## 2016-10-27 NOTE — Anesthesia Post-op Follow-up Note (Signed)
Anesthesia QCDR form completed.        

## 2016-10-27 NOTE — H&P (Signed)
Outpatient short stay form Pre-procedure 10/27/2016 8:56 AM Lollie Sails MD  Primary Physician: Dr. Frazier Richards  Reason for visit:  Colonoscopy  History of present illness:  Patient is a 76 year old female presenting today as above. 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 She is presenting today for routine follow-up colonoscopy. She tolerated her prep well. She does take 81 mg aspirin daily that his been held for several days. She takes no other aspirin or blood thinning agents.    Current Facility-Administered Medications:  .  0.9 %  sodium chloride infusion, , Intravenous, Continuous, Lollie Sails, MD, Last Rate: 20 mL/hr at 10/27/16 0834 .  0.9 %  sodium chloride infusion, , Intravenous, Continuous, Lollie Sails, MD  Prescriptions Prior to Admission  Medication Sig Dispense Refill Last Dose  . acetaminophen (TYLENOL ARTHRITIS PAIN) 650 MG CR tablet Take 650 mg by mouth every 8 (eight) hours as needed for pain.   Past Week at Unknown time  . aspirin 81 MG tablet Take 81 mg by mouth daily.   Past Week at Unknown time  . aspirin-acetaminophen-caffeine (EXCEDRIN MIGRAINE) 250-250-65 MG tablet Take by mouth every 6 (six) hours as needed for headache.   Past Week at Unknown time  . atorvastatin (LIPITOR) 80 MG tablet Take 80 mg by mouth at bedtime.    10/26/2016 at Unknown time  . Cholecalciferol (VITAMIN D3) 1000 units CAPS Take 2 capsules by mouth daily.   10/26/2016 at Unknown time  . fosinopril (MONOPRIL) 10 MG tablet Take 10 mg by mouth daily.   10/26/2016 at Unknown time  . gabapentin (NEURONTIN) 100 MG capsule Take 100 mg by mouth 3 (three) times daily.   10/26/2016 at Unknown time  . glimepiride (AMARYL) 4 MG tablet Take 4 mg by mouth daily with breakfast.   Past Week at Unknown time  . HYDROcodone-acetaminophen (NORCO) 5-325 MG tablet Take 1 tablet by mouth every 4 (four) hours as  needed for moderate pain. 6 tablet 0 Past Month at Unknown time  . metFORMIN (GLUCOPHAGE) 500 MG tablet Take by mouth 2 (two) times daily with a meal.   Past Week at Unknown time  . omeprazole (PRILOSEC) 40 MG capsule Take 40 mg by mouth daily.   10/26/2016 at Unknown time  . promethazine (PHENERGAN) 12.5 MG tablet Take 1 tablet (12.5 mg total) by mouth every 6 (six) hours as needed for nausea or vomiting. 6 tablet 0 Past Month at Unknown time  . cyclobenzaprine (FLEXERIL) 5 MG tablet Take 1 tablet (5 mg total) by mouth 3 (three) times daily. (Patient not taking: Reported on 10/27/2016) 30 tablet 0 Not Taking at Unknown time  . tamsulosin (FLOMAX) 0.4 MG CAPS capsule Take 1 capsule (0.4 mg total) by mouth daily after supper. (Patient not taking: Reported on 10/27/2016) 8 capsule 0 Not Taking at Unknown time     Allergies  Allergen Reactions  . Codeine   . Vicodin [Hydrocodone-Acetaminophen]      Past Medical History:  Diagnosis Date  . Arthritis   . Cardiac murmur Diagnosed in 2007  . Diabetes mellitus without complication ([place])   . Diverticulosis   . GERD (gastroesophageal reflux disease)   . Hyperlipidemia   . Hypertension   . Lower extremity edema   . PVD (peripheral vascular disease) ([place])     Review of systems:      Physical Exam    Heart  and lungs: Regular rate and rhythm without rub or gallop, lungs are bilaterally clear.    HEENT: Normocephalic atraumatic eyes are anicteric    Other:     Pertinant exam for procedure: Soft nontender nondistended bowel sounds positive normoactive.    Planned proceedures: Colonoscopy and indicated procedures. I have discussed the risks benefits and complications of procedures to include not limited to bleeding, infection, perforation and the risk of sedation and the patient wishes to proceed.    Lollie Sails, MD Gastroenterology 10/27/2016  8:56 AM

## 2016-10-27 NOTE — Anesthesia Preprocedure Evaluation (Signed)
Anesthesia Evaluation  Patient identified by MRN, date of birth, ID band Patient awake    Reviewed: Allergy & Precautions, H&P , NPO status , Patient's Chart, lab work & pertinent test results, reviewed documented beta blocker date and time   Airway Mallampati: II   Neck ROM: full    Dental  (+) Poor Dentition   Pulmonary neg pulmonary ROS,    Pulmonary exam normal        Cardiovascular hypertension, + Peripheral Vascular Disease  negative cardio ROS Normal cardiovascular exam Rhythm:regular Rate:Normal     Neuro/Psych negative neurological ROS  negative psych ROS   GI/Hepatic negative GI ROS, Neg liver ROS, GERD  Medicated,  Endo/Other  negative endocrine ROSdiabetesMorbid obesity  Renal/GU negative Renal ROS  negative genitourinary   Musculoskeletal   Abdominal   Peds  Hematology negative hematology ROS (+)   Anesthesia Other Findings Past Medical History: No date: Arthritis Diagnosed in 2007: Cardiac murmur No date: Diabetes mellitus without complication ([facility]) No date: Diverticulosis No date: GERD (gastroesophageal reflux disease) No date: Hyperlipidemia No date: Hypertension No date: Lower extremity edema No date: PVD (peripheral vascular disease) ([place]) Past Surgical History: 1990's: BREAST BIOPSY; Left     Comment:  neg- Done in Radiology 2008: CHOLECYSTECTOMY No date: COLON SURGERY 07/2008: COLONOSCOPY W/ POLYPECTOMY     Comment:  Dr. Rochel Brome 12/25/2014: COLONOSCOPY WITH PROPOFOL; N/A     Comment:  Procedure: COLONOSCOPY WITH PROPOFOL;  Surgeon: Lollie Sails, MD;  Location: [facility] ENDOSCOPY;  Service:               Endoscopy;  Laterality: N/A; 04/11/2015: CYSTOSCOPY WITH STENT PLACEMENT; Right     Comment:  Procedure: CYSTOSCOPY WITH STENT PLACEMENT;  Surgeon:               Hollice Espy, MD;  Location: [facility] ORS;  Service:               Urology;  Laterality: Right; Unsure of  Date: DILATION AND CURETTAGE OF UTERUS 2012: EYE SURGERY; Bilateral     Comment:  Cataract Removal- [facility] 04/11/2015: LAPAROSCOPIC RIGHT COLECTOMY; Right     Comment:  Procedure: LAPAROSCOPIC RIGHT COLECTOMY, REMOVAL OF               RIGHT URETERAL STENT;  Surgeon: Hubbard Robinson, MD;                Location: [facility] ORS;  Service: General;  Laterality:               Right; as child: TONSILLECTOMY BMI    Body Mass Index:  40.72 kg/m     Reproductive/Obstetrics negative OB ROS                             Anesthesia Physical Anesthesia Plan  ASA: III  Anesthesia Plan: General   Post-op Pain Management:    Induction:   PONV Risk Score and Plan:   Airway Management Planned:   Additional Equipment:   Intra-op Plan:   Post-operative Plan:   Informed Consent: I have reviewed the patients History and Physical, chart, labs and discussed the procedure including the risks, benefits and alternatives for the proposed anesthesia with the patient or authorized representative who has indicated his/her understanding and acceptance.   Dental Advisory Given  Plan Discussed with: CRNA  Anesthesia Plan  Comments:         Anesthesia Quick Evaluation

## 2016-10-28 ENCOUNTER — Encounter: Payer: Self-pay | Admitting: Gastroenterology

## 2016-10-28 ENCOUNTER — Other Ambulatory Visit: Payer: Self-pay | Admitting: Internal Medicine

## 2016-10-28 DIAGNOSIS — Z1231 Encounter for screening mammogram for malignant neoplasm of breast: Secondary | ICD-10-CM

## 2016-10-28 LAB — SURGICAL PATHOLOGY

## 2016-11-23 ENCOUNTER — Ambulatory Visit
Admission: RE | Admit: 2016-11-23 | Discharge: 2016-11-23 | Disposition: A | Discharge status: Home / Self Care | Payer: Medicare PPO | Source: Ambulatory Visit | Attending: Internal Medicine | Admitting: Internal Medicine

## 2016-11-23 DIAGNOSIS — Z1231 Encounter for screening mammogram for malignant neoplasm of breast: Secondary | ICD-10-CM | POA: Diagnosis not present

## 2017-10-18 ENCOUNTER — Other Ambulatory Visit: Payer: Self-pay | Admitting: Internal Medicine

## 2017-10-18 DIAGNOSIS — Z1231 Encounter for screening mammogram for malignant neoplasm of breast: Secondary | ICD-10-CM

## 2017-11-24 ENCOUNTER — Ambulatory Visit
Admission: RE | Admit: 2017-11-24 | Discharge: 2017-11-24 | Disposition: A | Discharge status: Home / Self Care | Payer: Medicare HMO | Source: Ambulatory Visit | Attending: Internal Medicine | Admitting: Internal Medicine

## 2017-11-24 DIAGNOSIS — Z1231 Encounter for screening mammogram for malignant neoplasm of breast: Secondary | ICD-10-CM

## 2018-10-04 ENCOUNTER — Other Ambulatory Visit: Payer: Self-pay | Admitting: Physical Medicine and Rehabilitation

## 2018-10-04 DIAGNOSIS — M5416 Radiculopathy, lumbar region: Secondary | ICD-10-CM

## 2018-10-14 ENCOUNTER — Ambulatory Visit: Payer: Medicare HMO

## 2018-10-22 ENCOUNTER — Ambulatory Visit
Admission: RE | Admit: 2018-10-22 | Discharge: 2018-10-22 | Disposition: A | Discharge status: Home / Self Care | Payer: Medicare HMO | Source: Ambulatory Visit | Attending: Physical Medicine and Rehabilitation | Admitting: Physical Medicine and Rehabilitation

## 2018-10-22 ENCOUNTER — Other Ambulatory Visit: Payer: Self-pay

## 2018-10-22 DIAGNOSIS — M5416 Radiculopathy, lumbar region: Secondary | ICD-10-CM | POA: Insufficient documentation

## 2018-11-08 ENCOUNTER — Other Ambulatory Visit: Payer: Self-pay | Admitting: Internal Medicine

## 2018-11-08 DIAGNOSIS — Z1231 Encounter for screening mammogram for malignant neoplasm of breast: Secondary | ICD-10-CM

## 2019-02-24 ENCOUNTER — Ambulatory Visit
Admission: RE | Admit: 2019-02-24 | Discharge: 2019-02-24 | Disposition: A | Discharge status: Home / Self Care | Payer: Medicare HMO | Source: Ambulatory Visit | Attending: Internal Medicine | Admitting: Internal Medicine

## 2019-02-24 DIAGNOSIS — Z1231 Encounter for screening mammogram for malignant neoplasm of breast: Secondary | ICD-10-CM | POA: Diagnosis present

## 2019-03-10 ENCOUNTER — Other Ambulatory Visit: Payer: Self-pay | Admitting: Neurosurgery

## 2019-03-31 ENCOUNTER — Encounter
Admission: RE | Admit: 2019-03-31 | Discharge: 2019-03-31 | Disposition: A | Discharge status: Home / Self Care | Payer: Medicare HMO | Source: Ambulatory Visit | Attending: Neurosurgery | Admitting: Neurosurgery

## 2019-03-31 DIAGNOSIS — I10 Essential (primary) hypertension: Secondary | ICD-10-CM | POA: Insufficient documentation

## 2019-03-31 DIAGNOSIS — E118 Type 2 diabetes mellitus with unspecified complications: Secondary | ICD-10-CM | POA: Insufficient documentation

## 2019-03-31 DIAGNOSIS — Z01818 Encounter for other preprocedural examination: Secondary | ICD-10-CM | POA: Insufficient documentation

## 2019-03-31 NOTE — Patient Instructions (Addendum)
Your procedure is scheduled on: 04-12-19 [facility] Report to Same Day Surgery 2nd floor medical mall [facility] Entrance-take elevator on left to 2nd floor.  Check in with surgery information desk.) To find out your arrival time please call [phone] between 1PM - 3PM on 04-11-19 TUESDAY  Remember: Instructions that are not followed completely may result in serious medical risk, up to and including death, or upon the discretion of your surgeon and anesthesiologist your surgery may need to be rescheduled.    _x___ 1. Do not eat food after midnight the night before your procedure. NO GUM OR CANDY AFTER MIDNIGHT. You may drink WATER up to 2 hours before you are scheduled to arrive at the hospital for your procedure.  Do not drink WATER within 2 hours of your scheduled arrival to the hospital.  Type 1 and type 2 diabetics should only drink water.   ____Ensure clear carbohydrate drink on the way to the hospital for bariatric patients  ____Ensure clear carbohydrate drink 3 hours before surgery.    __x__ 2. No Alcohol for 24 hours before or after surgery.   __x__3. No Smoking or e-cigarettes for 24 prior to surgery.  Do not use any chewable tobacco products for at least 6 hour prior to surgery   ____  4. Bring all medications with you on the day of surgery if instructed.    __x__ 5. Notify your doctor if there is any change in your medical condition     (cold, fever, infections).    x___6. On the morning of surgery brush your teeth with toothpaste and water.  You may rinse your mouth with mouth wash if you wish.  Do not swallow any toothpaste or mouthwash.   Do not wear jewelry, make-up, hairpins, clips or nail polish.  Do not wear lotions, powders, or perfumes.   Do not shave 48 hours prior to surgery. Men may shave face and neck.  Do not bring valuables to the hospital.    [facility] is not responsible for any belongings or valuables.               Contacts, dentures or bridgework  may not be worn into surgery.  Leave your suitcase in the car. After surgery it may be brought to your room.  For patients admitted to the hospital, discharge time is determined by your treatment team.  _  Patients discharged the day of surgery will not be allowed to drive home.  You will need someone to drive you home and stay with you the night of your procedure.    Please read over the following fact sheets that you were given:   [facility] Preparing for Surgery and or MRSA Information   _x___ TAKE THE FOLLOWING MEDICATION THE MORNING OF SURGERY WITH A SMALL SIP OF WATER. These include:  1. GABAPENTIN (NEURONTIN)  2. PRILOSEC (OMEPRAZOLE)  3. TAKE AN EXTRA PRILOSEC THE NIGHT BEFORE YOUR SURGERY  4.  5.  6.  ____Fleets enema or Magnesium Citrate as directed.   _x___ Use CHG Soap or sage wipes as directed on instruction sheet   ____ Use inhalers on the day of surgery and bring to hospital day of surgery  ____ Stop Metformin and Janumet 2 days prior to surgery.    ____ Take 1/2 of usual insulin dose the night before surgery and none on the morning surgery.   _x___ Follow recommendations from Cardiologist, Pulmonologist or PCP regarding stopping Aspirin, Coumadin, Plavix ,Eliquis, Effient, or Pradaxa,  and Pletal-STOP YOUR ASPIRIN 7 DAYS PRIOR TO YOUR SURGERY  X____Stop Anti-inflammatories such as Advil, Aleve, Ibuprofen, Motrin, Naproxen, Naprosyn, Goodies powders or aspirin products NOW- OK to take Tylenol and Celebrex.   ____ Stop supplements until after surgery.    ____ Bring C-Pap to the hospital.

## 2019-04-04 ENCOUNTER — Other Ambulatory Visit: Payer: Self-pay

## 2019-04-04 ENCOUNTER — Encounter
Admission: RE | Admit: 2019-04-04 | Discharge: 2019-04-04 | Disposition: A | Discharge status: Home / Self Care | Payer: Medicare HMO | Source: Ambulatory Visit | Attending: Neurosurgery | Admitting: Neurosurgery

## 2019-04-04 DIAGNOSIS — E118 Type 2 diabetes mellitus with unspecified complications: Secondary | ICD-10-CM | POA: Diagnosis not present

## 2019-04-04 DIAGNOSIS — I10 Essential (primary) hypertension: Secondary | ICD-10-CM | POA: Insufficient documentation

## 2019-04-04 DIAGNOSIS — Z01818 Encounter for other preprocedural examination: Secondary | ICD-10-CM | POA: Insufficient documentation

## 2019-04-04 LAB — CBC
HCT: 36.5 % (ref 36.0–46.0)
Hemoglobin: 10.7 g/dL — ABNORMAL LOW (ref 12.0–15.0)
MCH: 23.4 pg — ABNORMAL LOW (ref 26.0–34.0)
MCHC: 29.3 g/dL — ABNORMAL LOW (ref 30.0–36.0)
MCV: 79.7 fL — ABNORMAL LOW (ref 80.0–100.0)
Platelets: 204 10*3/uL (ref 150–400)
RBC: 4.58 MIL/uL (ref 3.87–5.11)
RDW: 15.9 % — ABNORMAL HIGH (ref 11.5–15.5)
WBC: 5 10*3/uL (ref 4.0–10.5)
nRBC: 0 % (ref 0.0–0.2)

## 2019-04-04 LAB — URINALYSIS, ROUTINE W REFLEX MICROSCOPIC
Bilirubin Urine: NEGATIVE
Glucose, UA: NEGATIVE mg/dL
Ketones, ur: NEGATIVE mg/dL
Nitrite: NEGATIVE
Protein, ur: NEGATIVE mg/dL
Specific Gravity, Urine: 1.015 (ref 1.005–1.030)
pH: 5.5 (ref 5.0–8.0)

## 2019-04-04 LAB — PROTIME-INR
INR: 1 (ref 0.8–1.2)
Prothrombin Time: 12.7 seconds (ref 11.4–15.2)

## 2019-04-04 LAB — SURGICAL PCR SCREEN
MRSA, PCR: NEGATIVE
Staphylococcus aureus: NEGATIVE

## 2019-04-04 LAB — BASIC METABOLIC PANEL
Anion gap: 9 (ref 5–15)
BUN: 18 mg/dL (ref 8–23)
CO2: 26 mmol/L (ref 22–32)
Calcium: 8.5 mg/dL — ABNORMAL LOW (ref 8.9–10.3)
Chloride: 104 mmol/L (ref 98–111)
Creatinine, Ser: 0.72 mg/dL (ref 0.44–1.00)
GFR calc Af Amer: >60 mL/min (ref >60)
GFR calc non Af Amer: >60 mL/min (ref >60)
Glucose, Bld: 124 mg/dL — ABNORMAL HIGH (ref 70–99)
Potassium: 3.6 mmol/L (ref 3.5–5.1)
Sodium: 139 mmol/L (ref 135–145)

## 2019-04-04 LAB — URINALYSIS, MICROSCOPIC (REFLEX)

## 2019-04-04 LAB — APTT: aPTT: 28 seconds (ref 24–36)

## 2019-04-05 LAB — TYPE AND SCREEN
ABO/RH(D): O NEG
Antibody Screen: NEGATIVE

## 2019-04-10 ENCOUNTER — Other Ambulatory Visit: Payer: Self-pay

## 2019-04-10 ENCOUNTER — Other Ambulatory Visit
Admission: RE | Admit: 2019-04-10 | Discharge: 2019-04-10 | Disposition: A | Discharge status: Home / Self Care | Payer: Medicare HMO | Source: Ambulatory Visit | Attending: Neurosurgery | Admitting: Neurosurgery

## 2019-04-10 LAB — SARS CORONAVIRUS 2 (TAT 6-24 HRS): SARS Coronavirus 2: NEGATIVE

## 2019-04-12 ENCOUNTER — Encounter: Payer: Self-pay | Admitting: Neurosurgery

## 2019-04-12 ENCOUNTER — Inpatient Hospital Stay: Payer: Medicare HMO

## 2019-04-12 ENCOUNTER — Other Ambulatory Visit: Payer: Self-pay

## 2019-04-12 ENCOUNTER — Inpatient Hospital Stay
Admission: RE | Admit: 2019-04-12 | Discharge: 2019-04-14 | DRG: 455 | Disposition: A | Discharge status: Home Health | Payer: Medicare HMO | Attending: Neurosurgery | Admitting: Neurosurgery

## 2019-04-12 ENCOUNTER — Inpatient Hospital Stay: Payer: Medicare HMO | Admitting: Anesthesiology

## 2019-04-12 ENCOUNTER — Encounter
Admission: RE | Disposition: A | Discharge status: Home / Self Care | Payer: Self-pay | Source: Home / Self Care | Attending: Neurosurgery

## 2019-04-12 DIAGNOSIS — M4316 Spondylolisthesis, lumbar region: Principal | ICD-10-CM | POA: Diagnosis present

## 2019-04-12 DIAGNOSIS — Z7984 Long term (current) use of oral hypoglycemic drugs: Secondary | ICD-10-CM | POA: Diagnosis not present

## 2019-04-12 DIAGNOSIS — E785 Hyperlipidemia, unspecified: Secondary | ICD-10-CM | POA: Diagnosis present

## 2019-04-12 DIAGNOSIS — Z20822 Contact with and (suspected) exposure to covid-19: Secondary | ICD-10-CM | POA: Diagnosis present

## 2019-04-12 DIAGNOSIS — I10 Essential (primary) hypertension: Secondary | ICD-10-CM | POA: Diagnosis present

## 2019-04-12 DIAGNOSIS — M5431 Sciatica, right side: Secondary | ICD-10-CM | POA: Diagnosis present

## 2019-04-12 DIAGNOSIS — Z79899 Other long term (current) drug therapy: Secondary | ICD-10-CM

## 2019-04-12 DIAGNOSIS — E1151 Type 2 diabetes mellitus with diabetic peripheral angiopathy without gangrene: Secondary | ICD-10-CM | POA: Diagnosis present

## 2019-04-12 DIAGNOSIS — K219 Gastro-esophageal reflux disease without esophagitis: Secondary | ICD-10-CM | POA: Diagnosis present

## 2019-04-12 DIAGNOSIS — Z981 Arthrodesis status: Secondary | ICD-10-CM

## 2019-04-12 DIAGNOSIS — Z7982 Long term (current) use of aspirin: Secondary | ICD-10-CM

## 2019-04-12 DIAGNOSIS — Z419 Encounter for procedure for purposes other than remedying health state, unspecified: Secondary | ICD-10-CM

## 2019-04-12 HISTORY — PX: ANTERIOR LATERAL LUMBAR FUSION WITH PERCUTANEOUS SCREW 1 LEVEL: SHX5553

## 2019-04-12 LAB — ABO/RH: ABO/RH(D): O NEG

## 2019-04-12 LAB — GLUCOSE, CAPILLARY
Glucose-Capillary: 99 mg/dL (ref 70–99)
Glucose-Capillary: 168 mg/dL — ABNORMAL HIGH (ref 70–99)

## 2019-04-12 SURGERY — ANTERIOR LATERAL LUMBAR FUSION WITH PERCUTANEOUS SCREW 1 LEVEL
Anesthesia: General

## 2019-04-12 MED ORDER — SODIUM CHLORIDE 0.9 % IV SOLN: INTRAVENOUS | Status: DC

## 2019-04-12 MED ORDER — PROPOFOL 500 MG/50ML IV EMUL
INTRAVENOUS | Status: AC
  Filled 2019-04-12: qty 50

## 2019-04-12 MED ORDER — KETOROLAC TROMETHAMINE 30 MG/ML IJ SOLN
30.0000 mg | Freq: Once | INTRAMUSCULAR | Status: AC
  Administered 2019-04-12: 18:00:00 30 mg via INTRAVENOUS
  Filled 2019-04-12: qty 1

## 2019-04-12 MED ORDER — LISINOPRIL 10 MG PO TABS
10.0000 mg | ORAL_TABLET | Freq: Every day | ORAL | Status: DC
  Administered 2019-04-12 – 2019-04-14 (×2): 10 mg via ORAL
  Filled 2019-04-12 (×3): qty 1

## 2019-04-12 MED ORDER — ONDANSETRON HCL 4 MG/2ML IJ SOLN
4.0000 mg | Freq: Four times a day (QID) | INTRAMUSCULAR | Status: DC | PRN
  Administered 2019-04-12: 4 mg via INTRAVENOUS
  Filled 2019-04-12: qty 2

## 2019-04-12 MED ORDER — REMIFENTANIL HCL 1 MG IV SOLR: INTRAVENOUS | Status: DC | PRN

## 2019-04-12 MED ORDER — GLYCOPYRROLATE 0.2 MG/ML IJ SOLN
INTRAMUSCULAR | Status: AC
  Filled 2019-04-12: qty 1

## 2019-04-12 MED ORDER — SUCCINYLCHOLINE CHLORIDE 20 MG/ML IJ SOLN
INTRAMUSCULAR | Status: DC | PRN
  Administered 2019-04-12: 200 mg via INTRAVENOUS

## 2019-04-12 MED ORDER — VANCOMYCIN HCL IN DEXTROSE 1-5 GM/200ML-% IV SOLN
1000.0000 mg | Freq: Once | INTRAVENOUS | Status: AC
  Administered 2019-04-12: 08:00:00 1000 mg via INTRAVENOUS

## 2019-04-12 MED ORDER — ACETAMINOPHEN 500 MG PO TABS
1000.0000 mg | ORAL_TABLET | Freq: Four times a day (QID) | ORAL | Status: AC
  Administered 2019-04-12 – 2019-04-13 (×4): 1000 mg via ORAL
  Filled 2019-04-12 (×5): qty 2

## 2019-04-12 MED ORDER — GLIMEPIRIDE 2 MG PO TABS
2.0000 mg | ORAL_TABLET | Freq: Every day | ORAL | Status: DC
  Administered 2019-04-13 – 2019-04-14 (×2): 2 mg via ORAL
  Filled 2019-04-12 (×3): qty 1

## 2019-04-12 MED ORDER — SENNA 8.6 MG PO TABS
1.0000 | ORAL_TABLET | Freq: Two times a day (BID) | ORAL | Status: DC
  Administered 2019-04-12 – 2019-04-14 (×4): 8.6 mg via ORAL
  Filled 2019-04-12 (×4): qty 1

## 2019-04-12 MED ORDER — FENTANYL CITRATE (PF) 100 MCG/2ML IJ SOLN
INTRAMUSCULAR | Status: DC | PRN
  Administered 2019-04-12 (×2): 50 ug via INTRAVENOUS

## 2019-04-12 MED ORDER — DEXAMETHASONE SODIUM PHOSPHATE 10 MG/ML IJ SOLN
INTRAMUSCULAR | Status: AC
  Filled 2019-04-12: qty 1

## 2019-04-12 MED ORDER — GABAPENTIN 300 MG PO CAPS
300.0000 mg | ORAL_CAPSULE | Freq: Two times a day (BID) | ORAL | Status: DC
  Administered 2019-04-12 – 2019-04-14 (×4): 300 mg via ORAL
  Filled 2019-04-12 (×4): qty 1

## 2019-04-12 MED ORDER — ONDANSETRON HCL 4 MG PO TABS: 4.0000 mg | ORAL_TABLET | Freq: Four times a day (QID) | ORAL | Status: DC | PRN

## 2019-04-12 MED ORDER — HYDROMORPHONE HCL 1 MG/ML IJ SOLN
0.5000 mg | INTRAMUSCULAR | Status: DC | PRN
  Administered 2019-04-13: 0.5 mg via INTRAVENOUS
  Filled 2019-04-12: qty 1

## 2019-04-12 MED ORDER — PHENOL 1.4 % MT LIQD
1.0000 | OROMUCOSAL | Status: DC | PRN
  Filled 2019-04-12: qty 177

## 2019-04-12 MED ORDER — GLYCOPYRROLATE 0.2 MG/ML IJ SOLN
INTRAMUSCULAR | Status: DC | PRN
  Administered 2019-04-12: .2 mg via INTRAVENOUS

## 2019-04-12 MED ORDER — FENTANYL CITRATE (PF) 100 MCG/2ML IJ SOLN
25.0000 ug | INTRAMUSCULAR | Status: AC | PRN
  Administered 2019-04-12 (×4): 25 ug via INTRAVENOUS

## 2019-04-12 MED ORDER — LIDOCAINE HCL (CARDIAC) PF 100 MG/5ML IV SOSY
PREFILLED_SYRINGE | INTRAVENOUS | Status: DC | PRN
  Administered 2019-04-12: 100 mg via INTRAVENOUS

## 2019-04-12 MED ORDER — EPHEDRINE SULFATE 50 MG/ML IJ SOLN
INTRAMUSCULAR | Status: DC | PRN
  Administered 2019-04-12 (×5): 10 mg via INTRAVENOUS

## 2019-04-12 MED ORDER — DEXAMETHASONE SODIUM PHOSPHATE 10 MG/ML IJ SOLN
INTRAMUSCULAR | Status: DC | PRN
  Administered 2019-04-12: 8 mg via INTRAVENOUS

## 2019-04-12 MED ORDER — METHOCARBAMOL 1000 MG/10ML IJ SOLN
500.0000 mg | Freq: Four times a day (QID) | INTRAVENOUS | Status: DC
  Filled 2019-04-12 (×14): qty 5

## 2019-04-12 MED ORDER — BUPIVACAINE-EPINEPHRINE 0.5% -1:200000 IJ SOLN
INTRAMUSCULAR | Status: DC | PRN
  Administered 2019-04-12: 2 mL

## 2019-04-12 MED ORDER — PROPOFOL 10 MG/ML IV BOLUS
INTRAVENOUS | Status: AC
  Filled 2019-04-12: qty 40

## 2019-04-12 MED ORDER — LIDOCAINE HCL (PF) 2 % IJ SOLN
INTRAMUSCULAR | Status: AC
  Filled 2019-04-12: qty 10

## 2019-04-12 MED ORDER — FENTANYL CITRATE (PF) 100 MCG/2ML IJ SOLN
INTRAMUSCULAR | Status: AC
  Filled 2019-04-12: qty 2

## 2019-04-12 MED ORDER — ATORVASTATIN CALCIUM 20 MG PO TABS
80.0000 mg | ORAL_TABLET | Freq: Every day | ORAL | Status: DC
  Administered 2019-04-12 – 2019-04-13 (×2): 80 mg via ORAL
  Filled 2019-04-12: qty 1
  Filled 2019-04-12 (×2): qty 4

## 2019-04-12 MED ORDER — SODIUM CHLORIDE 0.9% FLUSH: 3.0000 mL | INTRAVENOUS | Status: DC | PRN

## 2019-04-12 MED ORDER — THROMBIN 5000 UNITS EX SOLR
CUTANEOUS | Status: DC | PRN
  Administered 2019-04-12: 5000 [IU] via TOPICAL

## 2019-04-12 MED ORDER — POLYETHYLENE GLYCOL 3350 17 G PO PACK: 17.0000 g | PACK | Freq: Every day | ORAL | Status: DC | PRN

## 2019-04-12 MED ORDER — PANTOPRAZOLE SODIUM 40 MG PO TBEC
80.0000 mg | DELAYED_RELEASE_TABLET | Freq: Every day | ORAL | Status: DC
  Administered 2019-04-13 – 2019-04-14 (×2): 80 mg via ORAL
  Filled 2019-04-12 (×2): qty 2

## 2019-04-12 MED ORDER — ACETAMINOPHEN 10 MG/ML IV SOLN
INTRAVENOUS | Status: AC
  Filled 2019-04-12: qty 100

## 2019-04-12 MED ORDER — SODIUM CHLORIDE 0.9 % IV SOLN: INTRAVENOUS | Status: DC | PRN

## 2019-04-12 MED ORDER — SODIUM CHLORIDE 0.9 % IV SOLN
INTRAVENOUS | Status: DC | PRN
  Administered 2019-04-12: 100 ug via INTRAVENOUS

## 2019-04-12 MED ORDER — OXYCODONE HCL 5 MG/5ML PO SOLN: 5.0000 mg | Freq: Once | ORAL | Status: DC | PRN

## 2019-04-12 MED ORDER — KETOROLAC TROMETHAMINE 30 MG/ML IJ SOLN
INTRAMUSCULAR | Status: DC | PRN
  Administered 2019-04-12: 15 mg via INTRAVENOUS

## 2019-04-12 MED ORDER — PROPOFOL 10 MG/ML IV BOLUS
INTRAVENOUS | Status: DC | PRN
  Administered 2019-04-12: 150 mg via INTRAVENOUS
  Administered 2019-04-12: 50 mg via INTRAVENOUS

## 2019-04-12 MED ORDER — SODIUM CHLORIDE 0.9% FLUSH
3.0000 mL | Freq: Two times a day (BID) | INTRAVENOUS | Status: DC
  Administered 2019-04-12 – 2019-04-14 (×5): 3 mL via INTRAVENOUS

## 2019-04-12 MED ORDER — ACETAMINOPHEN 10 MG/ML IV SOLN
INTRAVENOUS | Status: DC | PRN
  Administered 2019-04-12: 1000 mg via INTRAVENOUS

## 2019-04-12 MED ORDER — SODIUM CHLORIDE 0.9 % IV SOLN: 250.0000 mL | INTRAVENOUS | Status: DC

## 2019-04-12 MED ORDER — OXYCODONE HCL 5 MG PO TABS: 5.0000 mg | ORAL_TABLET | Freq: Once | ORAL | Status: DC | PRN

## 2019-04-12 MED ORDER — CEFAZOLIN SODIUM-DEXTROSE 2-4 GM/100ML-% IV SOLN
2.0000 g | Freq: Once | INTRAVENOUS | Status: AC
  Administered 2019-04-12: 2 g via INTRAVENOUS

## 2019-04-12 MED ORDER — PROPOFOL 500 MG/50ML IV EMUL
INTRAVENOUS | Status: DC | PRN
  Administered 2019-04-12: 120 ug/kg/min via INTRAVENOUS

## 2019-04-12 MED ORDER — ONDANSETRON HCL 4 MG/2ML IJ SOLN
INTRAMUSCULAR | Status: DC | PRN
  Administered 2019-04-12: 4 mg via INTRAVENOUS

## 2019-04-12 MED ORDER — MENTHOL 3 MG MT LOZG
1.0000 | LOZENGE | OROMUCOSAL | Status: DC | PRN
  Filled 2019-04-12: qty 9

## 2019-04-12 MED ORDER — PHENYLEPHRINE HCL-NACL 10-0.9 MG/250ML-% IV SOLN
INTRAVENOUS | Status: DC | PRN
  Administered 2019-04-12: 25 ug/min via INTRAVENOUS

## 2019-04-12 MED ORDER — REMIFENTANIL HCL 1 MG IV SOLR
INTRAVENOUS | Status: DC | PRN
  Administered 2019-04-12: .1 ug/kg/min via INTRAVENOUS

## 2019-04-12 MED ORDER — VANCOMYCIN HCL IN DEXTROSE 1-5 GM/200ML-% IV SOLN
INTRAVENOUS | Status: AC
  Filled 2019-04-12: qty 200

## 2019-04-12 MED ORDER — SODIUM CHLORIDE 0.9 % IR SOLN
Status: DC | PRN
  Administered 2019-04-12: 1000 mL

## 2019-04-12 MED ORDER — BISACODYL 5 MG PO TBEC: 5.0000 mg | DELAYED_RELEASE_TABLET | Freq: Every day | ORAL | Status: DC | PRN

## 2019-04-12 MED ORDER — SODIUM CHLORIDE 0.9 % IV SOLN
INTRAVENOUS | Status: DC | PRN
  Administered 2019-04-12: 40 mL

## 2019-04-12 MED ORDER — PNEUMOCOCCAL VAC POLYVALENT 25 MCG/0.5ML IJ INJ: 0.5000 mL | INJECTION | INTRAMUSCULAR | Status: DC

## 2019-04-12 MED ORDER — SUCCINYLCHOLINE CHLORIDE 200 MG/10ML IV SOSY
PREFILLED_SYRINGE | INTRAVENOUS | Status: AC
  Filled 2019-04-12: qty 10

## 2019-04-12 MED ORDER — FENTANYL CITRATE (PF) 100 MCG/2ML IJ SOLN
INTRAMUSCULAR | Status: AC
  Administered 2019-04-12: 25 ug via INTRAVENOUS
  Filled 2019-04-12: qty 2

## 2019-04-12 MED ORDER — KETOROLAC TROMETHAMINE 30 MG/ML IJ SOLN
INTRAMUSCULAR | Status: AC
  Filled 2019-04-12: qty 1

## 2019-04-12 MED ORDER — MAGNESIUM CITRATE PO SOLN
1.0000 | Freq: Once | ORAL | Status: DC | PRN
  Filled 2019-04-12: qty 296

## 2019-04-12 MED ORDER — OXYCODONE HCL 5 MG PO TABS: 10.0000 mg | ORAL_TABLET | ORAL | Status: DC | PRN

## 2019-04-12 MED ORDER — ACETAMINOPHEN 325 MG PO TABS: 650.0000 mg | ORAL_TABLET | ORAL | Status: DC | PRN

## 2019-04-12 MED ORDER — CEFAZOLIN SODIUM-DEXTROSE 2-4 GM/100ML-% IV SOLN
INTRAVENOUS | Status: AC
  Filled 2019-04-12: qty 100

## 2019-04-12 MED ORDER — EPHEDRINE 5 MG/ML INJ
INTRAVENOUS | Status: AC
  Filled 2019-04-12: qty 20

## 2019-04-12 MED ORDER — ACETAMINOPHEN 650 MG RE SUPP: 650.0000 mg | RECTAL | Status: DC | PRN

## 2019-04-12 MED ORDER — ONDANSETRON HCL 4 MG/2ML IJ SOLN
INTRAMUSCULAR | Status: AC
  Filled 2019-04-12: qty 2

## 2019-04-12 MED ORDER — METHOCARBAMOL 500 MG PO TABS
750.0000 mg | ORAL_TABLET | Freq: Four times a day (QID) | ORAL | Status: DC
  Administered 2019-04-12 – 2019-04-14 (×8): 750 mg via ORAL
  Filled 2019-04-12 (×9): qty 2

## 2019-04-12 MED ORDER — BUPIVACAINE HCL (PF) 0.5 % IJ SOLN
INTRAMUSCULAR | Status: DC | PRN
  Administered 2019-04-12: 20 mL

## 2019-04-12 MED ORDER — OXYCODONE HCL 5 MG PO TABS
5.0000 mg | ORAL_TABLET | ORAL | Status: DC | PRN
  Administered 2019-04-12 (×2): 5 mg via ORAL
  Filled 2019-04-12 (×2): qty 1

## 2019-04-12 MED ORDER — REMIFENTANIL HCL 1 MG IV SOLR
INTRAVENOUS | Status: AC
  Filled 2019-04-12: qty 1000

## 2019-04-12 MED ORDER — LACTATED RINGERS IV SOLN: INTRAVENOUS | Status: DC | PRN

## 2019-04-12 SURGICAL SUPPLY — 83 items
BUR NEURO DRILL SOFT 3.0X3.8M (BURR) ×2 IMPLANT
CAGE MODULUS XL 10X18X50 - 10 (Cage) ×1 IMPLANT
CANISTER SUCT 1200ML W/VALVE (MISCELLANEOUS) ×4 IMPLANT
CHLORAPREP W/TINT 26 (MISCELLANEOUS) ×8 IMPLANT
CORD BIP STRL DISP 12FT (MISCELLANEOUS) ×2 IMPLANT
COUNTER NEEDLE 20/40 LG (NEEDLE) ×2 IMPLANT
COVER BACK TABLE REUSABLE LG (DRAPES) ×2 IMPLANT
COVER LIGHT HANDLE STERIS (MISCELLANEOUS) ×8 IMPLANT
COVER WAND RF STERILE (DRAPES) ×2 IMPLANT
CRADLE LAMINECT ARM (MISCELLANEOUS) ×4 IMPLANT
CUP MEDICINE 2OZ PLAST GRAD ST (MISCELLANEOUS) ×2 IMPLANT
DERMABOND ADVANCED (GAUZE/BANDAGES/DRESSINGS) ×2
DERMABOND ADVANCED .7 DNX12 (GAUZE/BANDAGES/DRESSINGS) ×2 IMPLANT
DRAPE C-ARM 42X72 X-RAY (DRAPES) ×4 IMPLANT
DRAPE C-ARMOR (DRAPES) ×4 IMPLANT
DRAPE INCISE IOBAN 66X45 STRL (DRAPES) ×4 IMPLANT
DRAPE LAPAROTOMY 100X77 ABD (DRAPES) ×4 IMPLANT
DRAPE MICROSCOPE SPINE 48X150 (DRAPES) ×2 IMPLANT
DRAPE POUCH INSTRU U-SHP 10X18 (DRAPES) ×2 IMPLANT
DRAPE SURG 17X11 SM STRL (DRAPES) ×16 IMPLANT
DRSG OPSITE POSTOP 4X6 (GAUZE/BANDAGES/DRESSINGS) IMPLANT
DRSG TEGADERM 2-3/8X2-3/4 SM (GAUZE/BANDAGES/DRESSINGS) IMPLANT
DRSG TEGADERM 4X4.75 (GAUZE/BANDAGES/DRESSINGS) IMPLANT
DRSG TEGADERM 6X8 (GAUZE/BANDAGES/DRESSINGS) IMPLANT
DRSG TELFA 3X8 NADH (GAUZE/BANDAGES/DRESSINGS) IMPLANT
DRSG TELFA 4X3 1S NADH ST (GAUZE/BANDAGES/DRESSINGS) IMPLANT
ELECT CAUTERY BLADE TIP 2.5 (TIP) ×6
ELECT EZSTD 165MM 6.5IN (MISCELLANEOUS) ×2
ELECT REM PT RETURN 9FT ADLT (ELECTROSURGICAL) ×4
ELECTRODE CAUTERY BLDE TIP 2.5 (TIP) ×2 IMPLANT
ELECTRODE EZSTD 165MM 6.5IN (MISCELLANEOUS) ×1 IMPLANT
ELECTRODE REM PT RTRN 9FT ADLT (ELECTROSURGICAL) ×2 IMPLANT
FEE INTRAOP MONITOR IMPULS NCS (MISCELLANEOUS) IMPLANT
FRAME EYE SHIELD (PROTECTIVE WEAR) ×4 IMPLANT
GLOVE BIOGEL PI IND STRL 7.0 (GLOVE) ×2 IMPLANT
GLOVE BIOGEL PI INDICATOR 7.0 (GLOVE) ×2
GLOVE SURG SYN 7.0 (GLOVE) ×8 IMPLANT
GLOVE SURG SYN 7.0 PF PI (GLOVE) ×4 IMPLANT
GLOVE SURG SYN 8.5  E (GLOVE) ×6
GLOVE SURG SYN 8.5 E (GLOVE) ×6 IMPLANT
GLOVE SURG SYN 8.5 PF PI (GLOVE) ×6 IMPLANT
GOWN SRG XL LVL 3 NONREINFORCE (GOWNS) ×2 IMPLANT
GOWN STRL NON-REIN TWL XL LVL3 (GOWNS) ×2
GOWN STRL REUS W/TWL MED LVL3 (GOWN DISPOSABLE) ×4 IMPLANT
GRADUATE 1200CC STRL 31836 (MISCELLANEOUS) ×2 IMPLANT
GUIDEWIRE NITINOL BEVEL TIP (WIRE) ×4 IMPLANT
INTRAOP MONITOR FEE IMPULS NCS (MISCELLANEOUS)
INTRAOP MONITOR FEE IMPULSE (MISCELLANEOUS)
KIT DILATOR XLIF 5 (KITS) ×1 IMPLANT
KIT NDL NVM5 EMG ELECT (KITS) IMPLANT
KIT NEEDLE NVM5 EMG ELECT (KITS) ×2 IMPLANT
KIT SPINAL PRONEVIEW (KITS) ×2 IMPLANT
KIT SURGICAL ACCESS MAXCESS 4 (KITS) ×1 IMPLANT
KIT TURNOVER KIT A (KITS) ×2 IMPLANT
KNIFE BAYONET SHORT DISCETOMY (MISCELLANEOUS) IMPLANT
MARKER SKIN DUAL TIP RULER LAB (MISCELLANEOUS) ×5 IMPLANT
NDL I PASS (NEEDLE) IMPLANT
NDL SAFETY ECLIPSE 18X1.5 (NEEDLE) ×1 IMPLANT
NEEDLE HYPO 18GX1.5 SHARP (NEEDLE) ×1
NEEDLE HYPO 22GX1.5 SAFETY (NEEDLE) ×2 IMPLANT
NEEDLE I PASS (NEEDLE) ×2 IMPLANT
PACK LAMINECTOMY NEURO (CUSTOM PROCEDURE TRAY) ×2 IMPLANT
PAD ARMBOARD 7.5X6 YLW CONV (MISCELLANEOUS) ×2 IMPLANT
PAD DRESSING TELFA 3X8 NADH (GAUZE/BANDAGES/DRESSINGS) IMPLANT
PENCIL ELECTRO HAND CTR (MISCELLANEOUS) ×3 IMPLANT
PUTTY DBM PROPEL MEDIUM (Putty) ×1 IMPLANT
ROD RELINE MAS TI LORD 5.5X40 (Rod) ×2 IMPLANT
SCREW LOCK RELINE 5.5 TULIP (Screw) ×4 IMPLANT
SCREW RELINE RED 6.5X45MM POLY (Screw) ×4 IMPLANT
SPOGE SURGIFLO 8M (HEMOSTASIS) ×1
SPONGE GAUZE 2X2 8PLY STRL LF (GAUZE/BANDAGES/DRESSINGS) IMPLANT
SPONGE SURGIFLO 8M (HEMOSTASIS) ×1 IMPLANT
STAPLER SKIN PROX 35W (STAPLE) IMPLANT
SUT DVC VLOC 3-0 CL 6 P-12 (SUTURE) ×2 IMPLANT
SUT ETHILON 3-0 FS-10 30 BLK (SUTURE)
SUT VIC AB 0 CT1 27 (SUTURE) ×1
SUT VIC AB 0 CT1 27XCR 8 STRN (SUTURE) ×1 IMPLANT
SUT VIC AB 2-0 CT1 18 (SUTURE) ×3 IMPLANT
SUTURE EHLN 3-0 FS-10 30 BLK (SUTURE) IMPLANT
SYR 30ML LL (SYRINGE) ×4 IMPLANT
TOWEL OR 17X26 4PK STRL BLUE (TOWEL DISPOSABLE) ×6 IMPLANT
TRAY FOLEY MTR SLVR 16FR STAT (SET/KITS/TRAYS/PACK) ×1 IMPLANT
TUBING CONNECTING 10 (TUBING) ×6 IMPLANT

## 2019-04-12 NOTE — H&P (Signed)
HPI: 04/12/2019 Ms. Conkel reports today with continued back and R leg pain.  She would like to move forward with surgery.  12/28/2018  Ms. Pullan returns to see me. She continues to have back pain. She did have physical therapy, which did not help. She is trying to manage her pain with medications, and is able to go about most of her activities of daily living right now. She was unable to cook Thanksgiving meal, which was hard for her.  11/24/2018 Ms. Dary Kretschmer is here today with a chief complaint of low back pain, right buttock pain that radiates to her hip and down the back of her right leg.   She has been having back and right leg pain for several years, but is worsened over the past 2 years. Over the past 6 months, she now has pain is about a 7 out of 10. She describes a burning that goes down the posterior aspect of her right thigh and into her anterolateral right calf. It is made worse by walking and standing. Is made better by rest or leaning forward on a grocery cart. She denies weakness or any bowel bladder dysfunction.  She recently began physical therapy, but has only gone to 2 appointments. She has tried injections in the past which were very successful, but they are less helpful more recently.  Conservative measures:  Physical therapy: has participated in 2 visits in October 2020 Multimodal medical therapy including regular antiinflammatories: tramadol, celebrex, diclofenac gel, gabapentin, cymbalta, codeine, norco, tylenol Injections: has tried epidural steroid injections 09/13/2018: Right L5-S1 transforaminal ESI (little benefit)  08/16/2018: Right L5-S1 transforaminal ESI (little benefit) 01/22/2016: Left hip joint injection (good relief) 11/14/2015: Left S1 transforaminal ESI  10/24/2015: Left S1 transforaminal ESI (mild to moderate relief) 07/04/2014: (mild relief) 06/06/2014: Bilateral S1 transforaminal ESI (temporary mild relief) 04/19/2014: Right trochanteric bursa  injection (good relief) 11/09/2011: Left trochanteric bursa injection (good relief) 06/03/2011: Bilateral S1 transforaminal ESI (moderate relief) 04/28/2011: Bilateral S1 transforaminal ESI (moderate relief)  Past Surgery: denies  [name] has no symptoms of cervical myelopathy.  The symptoms are causing a significant impact on the patient's life.   Review of Systems:  A 10 point review of systems is negative, except for the pertinent positives and negatives detailed in the HPI.  Past Medical History: Past Medical History:  Diagnosis Date  . Colon polyp  . Diabetes mellitus type 2, uncomplicated (CMS-HCC)  . Diverticulosis 12/25/2014  . History of chicken pox  . Hyperlipidemia  . Hypertension  . Tubular adenoma of colon, unspecified 12/25/2014   Past Surgical History: Past Surgical History:  Procedure Laterality Date  . CATARACT EXTRACTION Bilateral  . CHOLECYSTECTOMY  . COLON SURGERY  . COLONOSCOPY 12/25/2014  Tubular adenoma of colon/Repeat 23yr/MUS  . COLONOSCOPY 10/27/2016  Tubular adenoma of colon/Hyperplastic colon polyp/Repeat 42yrs/MUS  . DILATION AND CURETTAGE, DIAGNOSTIC / THERAPEUTIC  . Polypectomy 2010  . TONSILLECTOMY   Allergies  Allergen Reactions  . Codeine Anxiety    "Crazy headed"   Current Meds  Medication Sig  . acetaminophen (TYLENOL) 500 MG tablet Take 1,000 mg by mouth every 8 (eight) hours as needed for moderate pain or headache.  Marland Kitchen aspirin 81 MG tablet Take 81 mg by mouth daily.  Marland Kitchen atorvastatin (LIPITOR) 80 MG tablet Take 80 mg by mouth at bedtime.   . celecoxib (CELEBREX) 100 MG capsule Take 100 mg by mouth 2 (two) times daily.  . Cholecalciferol (VITAMIN D3) 1000 units CAPS Take 1,000 Units  by mouth daily.   . diclofenac Sodium (VOLTAREN) 1 % GEL Apply 1 application topically 4 (four) times daily as needed (pain).  . fosinopril (MONOPRIL) 10 MG tablet Take 10 mg by mouth 2 (two) times daily.   Marland Kitchen gabapentin (NEURONTIN) 300 MG capsule  Take 300 mg by mouth in the morning and at bedtime.  Marland Kitchen glimepiride (AMARYL) 2 MG tablet Take 2 mg by mouth daily with breakfast.  . omeprazole (PRILOSEC) 40 MG capsule Take 40 mg by mouth every morning.     Social History: Social History   Tobacco Use  . Smoking status: Never Smoker  . Smokeless tobacco: Never Used  Substance Use Topics  . Alcohol use: No  Alcohol/week: 0.0 standard drinks  . Drug use: No   Family Medical History: Family History  Problem Relation Age of Onset  . Prostate cancer Father  . Myocardial Infarction (Heart attack) Father  . Alzheimer's disease Mother  . Cervical cancer Sister   Physical Examination:  Today's Vitals   04/12/19 0716  BP: (!) 159/64  Pulse: 73  Resp: 18  Temp: (!) 97.4 F (36.3 C)  TempSrc: Tympanic  SpO2: 100%  Weight: 106.1 kg  Height: 5\' 6"  (1.676 m)  PainSc: 0-No pain   Body mass index is 37.77 kg/m.  Heart sounds normal with murmur during systole. Chest Clear to Auscultation Bilaterally.   General: Patient is well developed, well nourished, calm, collected, and in no apparent distress. Attention to examination is appropriate.  Psychiatric: Patient is non-anxious.  Head: Pupils equal, round, and reactive to light.  ENT: Oral mucosa appears well hydrated.  Neck: Supple. Full range of motion.  Respiratory: Patient is breathing without any difficulty.  Extremities: No edema.  Vascular: Palpable dorsal pedal pulses.  Skin: On exposed skin, there are no abnormal skin lesions.  NEUROLOGICAL:   Awake, alert, oriented to person, place, and time. Speech is clear and fluent. Fund of knowledge is appropriate.   Cranial Nerves: Pupils equal round and reactive to light. Facial tone is symmetric. Facial sensation is symmetric. Shoulder shrug is symmetric. Tongue protrusion is midline. There is no pronator drift.  ROM of spine: diminished extension. Palpation of spine: non tender.   Strength: Side Biceps Triceps  Deltoid Interossei Grip Wrist Ext. Wrist Flex.  R 5 5 5 5 5 5 5   L 5 5 5 5 5 5 5    Side Iliopsoas Quads Hamstring PF DF EHL  R 5 5 5 5 5 5   L 5 5 5 5 5 5    Reflexes are 1+ and symmetric at the biceps, triceps, brachioradialis, patella and achilles. Hoffman's is absent. Clonus is not present. Toes are down-going.  Bilateral upper and lower extremity sensation is intact to light touch.  Gait is antalgic. No evidence of dysmetria noted.  Medical Decision Making  Imaging: MRI L spine 10/22/2018  IMPRESSION: L2-3: Broad-based disc herniation slightly more prominent towards the right. Slight upward turning behind the inferior endplate of L2. Mild stenosis of the right lateral recess but without distinct neural compression.  L3-4: Disc bulge. Mild facet hypertrophy. No compressive stenosis.  L4-5: Advanced bilateral facet arthropathy with 3 mm of anterolisthesis. Bulging of the disc with right foraminal herniation. Multifactorial stenosis that could cause neural compression on either or both sides. The stenosis could worsen with standing or flexion. Likely compression of the right L4 nerve by the foraminal disc.  L5-S1: Bilateral facet arthropathy with 2 mm of anterolisthesis. Disc bulge more towards the left.  Narrowing of the left subarticular lateral recess and neural foramen without definite neural compression. This appearance could worsen with standing or flexion  Electronically Signed By: Nelson Chimes M.D. On: 10/22/2018 21:54  Flex Ext Xrays 11/24/2018 Flexion-extension x-rays show anterolisthesis of 7 mm on extension increasing to 10 mm in flexion.  I have personally reviewed the images and agree with the above interpretation.  Assessment and Plan: Ms. Cokley is a pleasant 79 y.o. female with back pain with right-sided sciatica.   She has failed conservative management and elects to proceed with surgery.  L4-5 XLIF.PSF.  Plan reviewed.     Meade Maw  MD, [facility] Department of Neurosurgery

## 2019-04-12 NOTE — TOC Initial Note (Signed)
Transition of Care [facility]) - Initial/Assessment Note    Patient Details  Name: Carly Bell MRN: [id number] Date of Birth: 07-Apr-1940  [facility]) CM/SW Contact:    Elease Hashimoto, LCSW Phone Number: 04/12/2019, 1:53 PM  Clinical Narrative:   Met with pt and daughter-teresa who is in the room to discuss possible discharge needs. Pt lives alone but daughter has been working for [employer] from her home to be there with her. She was not using an assistive device and had no home health prior to admission. Discussed she will need someone with her when first goes home and daughter reports it will be her. She was independent prior to admission and still driving. She tried two PT sessions back in 12/20 but they did not help her. Will continue to work on discharge needs. See in am              Expected Discharge Plan: [place] Barriers to Discharge: Continued Medical Work up   Patient Goals and CMS Choice Patient states their goals for this hospitalization and ongoing recovery are:: I hope to do well and get better from this surgery      Expected Discharge Plan and Services Expected Discharge Plan: [place] In-house Referral: Clinical Social Work     Living arrangements for the past 2 months: [place]                                      Prior Living Arrangements/Services Living arrangements for the past 2 months: Single Family Home Lives with:: Self          Need for Family Participation in Patient Care: Yes (Comment) Care giver support system in place?: Yes (comment)      Activities of Daily Living Home Assistive Devices/Equipment: Dentures (specify type)(partial top) ADL Screening (condition at time of admission) Patient's cognitive ability adequate to safely complete daily activities?: Yes Is the patient deaf or have difficulty hearing?: No Does the patient have difficulty seeing, even when wearing  glasses/contacts?: No Does the patient have difficulty concentrating, remembering, or making decisions?: No Patient able to express need for assistance with ADLs?: Yes Does the patient have difficulty dressing or bathing?: No Independently performs ADLs?: Yes (appropriate for developmental age) Does the patient have difficulty walking or climbing stairs?: No Weakness of Legs: None Weakness of Arms/Hands: None  Permission Sought/Granted   Permission granted to share information with : Yes, Verbal Permission Granted  Share Information with NAME: Helene Kelp     Permission granted to share info w Relationship: daughter     Emotional Assessment Appearance:: Appears stated age Attitude/Demeanor/Rapport: Engaged Affect (typically observed): Adaptable, Accepting Orientation: : Oriented to Self, Oriented to Place, Oriented to  Time, Oriented to Situation      Admission diagnosis:  S/P lumbar fusion [Z98.1] Patient Active Problem List   Diagnosis Date Noted  . S/P lumbar fusion 04/12/2019  . Degeneration of intervertebral disc of lumbar region 10/18/2014  . Lumbar canal stenosis 04/19/2014  . Bursitis, trochanteric 04/19/2014  . Morbid obesity ([place]) 11/04/2013  . Type 2 diabetes mellitus with other diabetic neurological complication ([place]) 28/78/6767  . Generalized OA 07/08/2013  . Acid reflux 07/08/2013  . Benign hypertension 07/08/2013  . HLD (hyperlipidemia) 07/08/2013   PCP:  Kirk Ruths, MD Pharmacy:   [facility] [address] ([address]),  [place] - [address] [place] ([place])  [id number] Phone: [phone] Fax: [phone]     Social Determinants of Health (SDOH) Interventions    Readmission Risk Interventions No flowsheet data found.  

## 2019-04-12 NOTE — Anesthesia Procedure Notes (Signed)
Procedure Name: Intubation Date/Time: 04/12/2019 8:41 AM Performed by: Lowry Bowl, CRNA Pre-anesthesia Checklist: Patient identified, Emergency Drugs available, Suction available and Patient being monitored Patient Re-evaluated:Patient Re-evaluated prior to induction Oxygen Delivery Method: Circle system utilized Preoxygenation: Pre-oxygenation with 100% oxygen Induction Type: IV induction and Cricoid Pressure applied Ventilation: Mask ventilation without difficulty Laryngoscope Size: 3 and McGraph Grade View: Grade I Tube type: Oral Tube size: 7.0 mm Number of attempts: 1 Airway Equipment and Method: Stylet and Video-laryngoscopy Placement Confirmation: ETT inserted through vocal cords under direct vision,  positive ETCO2 and breath sounds checked- equal and bilateral Secured at: 21 cm Tube secured with: Tape Dental Injury: Teeth and Oropharynx as per pre-operative assessment  Comments: Videoscope d/t poor dentition / loose teeth

## 2019-04-12 NOTE — Op Note (Signed)
Indications: Carly Bell is a 79 yo female who presented with anterolisthesis.  She failed conservative management and elected for surgical management.   Findings: correction of anterolisthesiss  Preoperative Diagnosis: anterolisthesis (M43.10 Postoperative Diagnosis: same   EBL: 50 ml IVF: 800 ml Drains: none Disposition: Extubated and Stable to PACU Complications: none  No foley catheter was placed.   Preoperative Note:   Risks of surgery discussed include: infection, bleeding, stroke, coma, death, paralysis, CSF leak, nerve/spinal cord injury, numbness, tingling, weakness, complex regional pain syndrome, recurrent stenosis and/or disc herniation, vascular injury, development of instability, neck/back pain, need for further surgery, persistent symptoms, development of deformity, and the risks of anesthesia. The patient understood these risks and agreed to proceed.  NAME OF ANTERIOR PROCEDURE:               1. Anterior lumbar interbody fusion via a left lateral retroperitoneal approach at L4/5 2. Placement of a Lordotic Modulus  10x18x50 interbody cage, filled with Demineralized Bone Matrix    NAME OF POSTERIOR PROCEDURE: 1. Posterior instrumentation using   Nuvasive Reline Instrumentation 2. Posterolateral fusion, L4-5      PROCEDURE:  Patient was brought to the operating room, intubated, turned to the lateral position.  All pressure points were checked and double-checked.  The patient was prepped and draped in the standard fashion. Prior to prepping, fluoroscopy was brought in and the patient was positioned with a large bump under the contralateral side between the iliac crest and rib cage, allowing the area between the iliac crest and the lateral aspect of the rib cage to open and increase the ability to reach inferiorly, to facilitate entry into the disc space.  The incision was marked upon the skin both the location of the disc space as well as the superior most aspect of the  iliac crest.  Based on the identification of the disc space an incision was prepared, marked upon the skin and eventually was used for our lateral incision.  The fluoroscopy was turned into a cross table A/P image in order to confirm that the patient's spine remained in a perpendicular trajectory to the floor without rotation.  Once confirming that all the pressure points were checked and double-checked and the patient remained in sturdy position strapped down in this slightly jack-knifed lateral position, the patient was prepped and draped in standard fashion.  The skin was injected with local anesthetic, then incised until the abdominal wall fascia was noted.  I bluntly dissected posteriorly until we were able to identify the posterior musculature near petit's triangle.  At this point, using primarily blunt dissection with our finger aided with a metzenbaum scissor, were able to enter the retroperitoneal cavity.  The retroperitoneal potential space was opened further until palpating out the psoas muscle, the medial aspect of the iliac crest, the medial aspect of the last rib and continued to define the retroperitoneal space with blunt dissection in order to facilitate safe placement of our dilators.    While protecting by dissecting directly onto a finger in the retroperitoneum, the retroperitoneal space was entered safely from the lateral incision and the initial dilator placed onto the muscle belly of the psoas.  While directly stimulating the dilator and after radiographically confirming our location relative to the disc space, I placed the dilator through the psoas.  The dilators were stimulated to ensure remaining safely away from any of the lumbar plexus nerves; the dilators were repositioned until no pathologic stimulation was appreciated.  Once I had  confirmed the location of our initial dilator radiographically, a K-wire secured the dilator into the L4/5 disc space and confirmed position under A/P and  lateral fluoroscopy.  At this point, I dilated up with direct stimulation to confirm lack of pathologic stimulation.  Once all the dilators were in position, I placed in the retractor and secured it onto the table, locked into position and confirmed under A/P and lateral fluoroscopy to confirm our approach angle to the disc space as well as location relative to the disc space.  I then placed the muscle stimulator in through the working channel down to the vertebral body, stimulating the entire lateral surface of the vertebral body and any of the visualized psoas muscle that was adjacent to the retractor, confirming again the safe passage to the psoas before we began performing the discectomy.  At this point, we began our discectomy at L4/5.  The disc was incised laterally throughout the extent of our exposure. Using a combination of pituitary rongeurs, Kerrison rongeurs, rasps, curettes of various sorts, we were able to begin to clean out the disc space.  Once we had cleaned out the majority of the disc space, we then cut the lateral annulus with a cob, breaking the lateral annual attachments on the contralateral side by subtly working the cob through the annulus while using flouroscopy.  Care was taken not to extend further than required after cutting the annular attachments.  After this had been performed, we prepared the endplates for placement of our graft, sized a graft to the disc space by serially dilating up in trial sizes until we confirmed that our graft would be well positioned, allowing distraction while maintaining good grip.  This was confirmed under A/P and lateral fluoroscopy in order to ensure its placement as an eventual trial for placement of our final graft.  We irrigated with bacteriostatic saline.  Once confirmed placement, the Modulus implant filled with allograft was impacted into position at L4/5.   Through a combination of intradiscal distraction and anterior releasing, we were able  to correct the anterior deformity during disc preparation and placement of the graft.          At this point, final radiographs were performed, and we began closure.  The wound was closed using 0 Vicryl interrupted suture in the fascia and 2-0 Vicryl inverted suture were placed in the subcutaneous tissue and dermis. 3-0 monocryl was used for final closure. Dermabond was used to close the skin.    After closing the anterior part in layers, the patient was repositioned into prone position.  All pressure points were checked and double-checked and we brought in fluoroscopy to confirm our approach angles for putting in percutaneous pedicle screws.  The pedicles were marked using true AP flouroscopy, adjusting the angle at each level.  We then prepped and draped the patient in the standard fashion.  At this point, incisions were made for placing percutaneous pedicle screw instrumentation at L4-5.  Starting at L4, a Jamsheedi needle was used to cannulate the pedicle bilaterally using AP flouroscopy. Direct stimulation was used on the needle without any low (<15 mAmp) stimulation thresholds. After cannulation of the pedicle to 30 mm, a K-wire was placed through the [place] approximately and secured.   Using a similar technique, the pedicles at L4-L5 were cannulated and K wires secured. The K wires were then checked using lateral flouroscopy to ensure placement into the vertebral bodies. After confirming placement of K wires, cannulated pedicle screws were introduced  over the K wires at each level.  After advancing each screw into the vertebral body approximately 25-30 mm, the K wire was removed.At each level, 6.5x57mm Nuvasive Reline pedicle screws were placed under lateral flouroscopy. Once the screws were placed, the screw extensions were then linked, a path was formed for the rod and a rod was utilized to connect the screws.  We then compressed, torqued / counter-torqued and removed the screw assembly. Once  performed on each side, confirmatory AP and lateral x-rays were taken and the case was completed.   The posterolateral elements were prepared for arthrodesis, and allograft was placed over the posterolateral elements to aid in fusion.  Again we confirmed radiographically and began our closure.  The wound was closed using 0 Vicryl interrupted suture in the fascia, 2-0 Vicryl inverted suture were placed in the subcutaneous tissue and dermis. 3-0 monocryl was used for final closure. Dermabond was used to close the skin.    Needle, lap and all counts were correct at the end of the case.      Marin Olp PA assisted in the entire procedure.  Meade Maw MD Neurosurgery

## 2019-04-12 NOTE — Progress Notes (Signed)
Procedure: L4-5 XLIF Procedure date: 04/12/2019 Diagnosis: Anterolisthesis  History: Carly Bell is s/p L4-5 XLIF for anterolisthesis  POD0: Tolerated procedure well. Evaluated in post op recovery still disoriented from anesthesia but able to answer questions and obey commands.  Complains of back pain.  Denies any new lower extremity pain/numbness/tingling.  Physical Exam: Vitals:   04/12/19 1137 04/12/19 1152  BP: (!) 150/75 (!) 138/56  Pulse: 97 88  Resp: 18 19  Temp: (!) 96.6 F (35.9 C)   SpO2: 100% 100%   Strength: Able to move all extremities independently.  Unable to fully and accurately assess strength at this time Sensation: Unable to accurately assess at this time Skin: Glue intact at lateral incision site.  Dressing intact at lumbar incision sites.  Data:  No results for input(s): NA, K, CL, CO2, BUN, CREATININE, LABGLOM, GLUCOSE, CALCIUM in the last 168 hours. No results for input(s): AST, ALT, ALKPHOS in the last 168 hours.  Invalid input(s): TBILI   No results for input(s): WBC, HGB, HCT, PLT in the last 168 hours. No results for input(s): APTT, INR in the last 168 hours.       Other tests/results: Lumbar x-rays pending  Assessment/Plan:  Carly Bell is POD0 s/p L4-5 XLIF for anterolisthesis. Will continue to monitor  - mobilize - pain control - DVT prophylaxis - PTOT - Brace -patient supplied - Imaging -pending - Catheter care  Marin Olp [name] Department of Neurosurgery

## 2019-04-12 NOTE — Anesthesia Preprocedure Evaluation (Signed)
Anesthesia Evaluation  Patient identified by MRN, date of birth, ID band Patient awake    Reviewed: Allergy & Precautions, H&P , NPO status , Patient's Chart, lab work & pertinent test results  History of Anesthesia Complications Negative for: history of anesthetic complications  Airway Mallampati: III  TM Distance: <3 FB Neck ROM: limited    Dental  (+) Chipped, Poor Dentition, Missing   Pulmonary neg pulmonary ROS, neg shortness of breath,           Cardiovascular Exercise Tolerance: Good hypertension, (-) angina+ Peripheral Vascular Disease  (-) Past MI and (-) DOE + Valvular Problems/Murmurs      Neuro/Psych negative neurological ROS  negative psych ROS   GI/Hepatic Neg liver ROS, GERD  Medicated and Controlled,  Endo/Other  diabetes, Type 2  Renal/GU      Musculoskeletal  (+) Arthritis ,   Abdominal   Peds  Hematology negative hematology ROS (+)   Anesthesia Other Findings Past Medical History: No date: Arthritis Diagnosed in 2007: Cardiac murmur No date: Diabetes mellitus without complication (HCC) No date: Diverticulosis No date: GERD (gastroesophageal reflux disease) No date: Hyperlipidemia No date: Hypertension No date: Lower extremity edema No date: PVD (peripheral vascular disease) (HCC)  Past Surgical History: 1990's: BREAST BIOPSY; Left     Comment:  neg- Done in Radiology 2008: CHOLECYSTECTOMY No date: COLON SURGERY     Comment:  bowel obstruction 07/2008: COLONOSCOPY W/ POLYPECTOMY     Comment:  Dr. Rochel Brome 12/25/2014: COLONOSCOPY WITH PROPOFOL; N/A     Comment:  Procedure: COLONOSCOPY WITH PROPOFOL;  Surgeon: Lollie Sails, MD;  Location: [facility] ENDOSCOPY;  Service:               Endoscopy;  Laterality: N/A; 10/27/2016: COLONOSCOPY WITH PROPOFOL; N/A     Comment:  Procedure: COLONOSCOPY WITH PROPOFOL;  Surgeon:               Lollie Sails, MD;  Location: [facility]  ENDOSCOPY;                Service: Endoscopy;  Laterality: N/A; 04/11/2015: CYSTOSCOPY WITH STENT PLACEMENT; Right     Comment:  Procedure: CYSTOSCOPY WITH STENT PLACEMENT;  Surgeon:               Hollice Espy, MD;  Location: [facility] ORS;  Service:               Urology;  Laterality: Right; Unsure of Date: DILATION AND CURETTAGE OF UTERUS 2012: EYE SURGERY; Bilateral     Comment:  Cataract Removal- [place] 04/11/2015: LAPAROSCOPIC RIGHT COLECTOMY; Right     Comment:  Procedure: LAPAROSCOPIC RIGHT COLECTOMY, REMOVAL OF               RIGHT URETERAL STENT;  Surgeon: Hubbard Robinson, MD;                Location: [facility] ORS;  Service: General;  Laterality:               Right; as child: TONSILLECTOMY  BMI    Body Mass Index: 37.77 kg/m      Reproductive/Obstetrics negative OB ROS                             Anesthesia Physical Anesthesia Plan  ASA: III  Anesthesia Plan: General ETT   Post-op  Pain Management:    Induction: Intravenous  PONV Risk Score and Plan: Ondansetron, Dexamethasone, Midazolam and Treatment may vary due to age or medical condition  Airway Management Planned: Oral ETT and Video Laryngoscope Planned  Additional Equipment:   Intra-op Plan:   Post-operative Plan: Extubation in OR  Informed Consent: I have reviewed the patients History and Physical, chart, labs and discussed the procedure including the risks, benefits and alternatives for the proposed anesthesia with the patient or authorized representative who has indicated his/her understanding and acceptance.     Dental Advisory Given  Plan Discussed with: Anesthesiologist, CRNA and Surgeon  Anesthesia Plan Comments: (Patient consented for risks of anesthesia including but not limited to:  - adverse reactions to medications - damage to teeth, lips or other oral mucosa - sore throat or hoarseness - Damage to heart, brain, lungs or loss of life  Patient  voiced understanding.)        Anesthesia Quick Evaluation

## 2019-04-12 NOTE — Transfer of Care (Signed)
Immediate Anesthesia Transfer of Care Note  Patient: Carly Bell  Procedure(s) Performed: L4-5 LATERAL LUMBAR INTERBODY FUSION WITH POSTERIOR FIXATION AT L4-5 (N/A )  Patient Location: PACU  Anesthesia Type:General  Level of Consciousness: awake, drowsy and patient cooperative  Airway & Oxygen Therapy: Patient Spontanous Breathing and Patient connected to face mask oxygen  Post-op Assessment: Report given to RN and Post -op Vital signs reviewed and stable  Post vital signs: Reviewed and stable  Last Vitals:  Vitals Value Taken Time  BP 150/75 04/12/19 1137  Temp 35.9 C 04/12/19 1137  Pulse 86 04/12/19 1149  Resp 16 04/12/19 1149  SpO2 100 % 04/12/19 1149  Vitals shown include unvalidated device data.  Last Pain:  Vitals:   04/12/19 0716  TempSrc: Tympanic  PainSc: 0-No pain         Complications: No apparent anesthesia complications

## 2019-04-12 NOTE — Anesthesia Postprocedure Evaluation (Signed)
Anesthesia Post Note  Patient: Carly Bell  Procedure(s) Performed: L4-5 LATERAL LUMBAR INTERBODY FUSION WITH POSTERIOR FIXATION AT L4-5 (N/A )  Patient location during evaluation: PACU Anesthesia Type: General Level of consciousness: awake and alert Pain management: pain level controlled Vital Signs Assessment: post-procedure vital signs reviewed and stable Respiratory status: spontaneous breathing, nonlabored ventilation, respiratory function stable and patient connected to nasal cannula oxygen Cardiovascular status: blood pressure returned to baseline and stable Postop Assessment: no apparent nausea or vomiting Anesthetic complications: no     Last Vitals:  Vitals:   04/12/19 1222 04/12/19 1237  BP: (!) 144/67 133/74  Pulse: 85 86  Resp: 14 13  Temp:  (!) 36.1 C  SpO2: 98% 100%    Last Pain:  Vitals:   04/12/19 1237  TempSrc:   PainSc: 4                  Precious Haws [place]

## 2019-04-13 ENCOUNTER — Inpatient Hospital Stay: Payer: Medicare HMO

## 2019-04-13 LAB — GLUCOSE, CAPILLARY: Glucose-Capillary: 120 mg/dL — ABNORMAL HIGH (ref 70–99)

## 2019-04-13 MED ORDER — KETOROLAC TROMETHAMINE 15 MG/ML IJ SOLN
15.0000 mg | Freq: Four times a day (QID) | INTRAMUSCULAR | Status: AC
  Administered 2019-04-13 – 2019-04-14 (×5): 15 mg via INTRAVENOUS
  Filled 2019-04-13 (×5): qty 1

## 2019-04-13 NOTE — Evaluation (Signed)
Physical Therapy Evaluation Patient Details Name: Carly Bell MRN: [id number] DOB: 1940/12/01 Today's Date: 04/13/2019   History of Present Illness  Pt is a 79 y.o. female s/p L4-5 XLIF for anterolisthesis (back and R leg pain symptoms) 04/12/19.  PMH includes htn, PVD, DM.  Clinical Impression  Prior to hospital admission, pt was independent with functional mobility; lives alone but has family that can assist upon hospital discharge.  Currently pt is SBA with transfers and CGA with ambulating 120 feet with RW (improved gait mechanics noted with use of RW compared to no AD).  Pain 6/10 low back and R lateral thigh beginning of session and 2-3/10 end of session resting in recliner.  Pt would benefit from skilled PT to address noted impairments and functional limitations (see below for any additional details).  Upon hospital discharge, pt would benefit from support from family.    Follow Up Recommendations Follow surgeon's recommendation for DC plan and follow-up therapies    Equipment Recommendations  Rolling walker with 5" wheels    Recommendations for Other Services       Precautions / Restrictions Precautions Precautions: Fall;Back Precaution Booklet Issued: Yes (comment) Precaution Comments: No bending, arching, twisting.  Brace when upright. Restrictions Weight Bearing Restrictions: No      Mobility  Bed Mobility       General bed mobility comments: Deferred (pt sitting in recliner beginning and end of session)  Transfers Overall transfer level: Needs assistance Equipment used: Rolling walker (2 wheeled) Transfers: Sit to/from Stand Sit to Stand: Supervision       General transfer comment: fairly strong stand from recliner x2 trials  Ambulation/Gait Ambulation/Gait assistance: Min guard Gait Distance (Feet): 120 Feet(plus 20 feet no AD) Assistive device: Rolling walker (2 wheeled)   Gait velocity: decreased   General Gait Details: increased B lateral sway and  decreased B LE step length ambulating without AD compared to with RW; steady with RW use  Stairs            Wheelchair Mobility    Modified Rankin (Stroke Patients Only)       Balance Overall balance assessment: Needs assistance Sitting-balance support: Feet supported;No upper extremity supported Sitting balance-Leahy Scale: Good Sitting balance - Comments: steady sitting reaching within BOS     Standing balance-Leahy Scale: Good Standing balance comment: steady standing reaching within BOS                             Pertinent Vitals/Pain Pain Assessment: 0-10 Pain Score: 3  Pain Location: low back and R lateral thigh Pain Descriptors / Indicators: Sore;Aching;Guarding Pain Intervention(s): Limited activity within patient's tolerance;Monitored during session;Premedicated before session;Repositioned  Vitals (HR and O2 on room air) stable and WFL throughout treatment session.    Home Living Family/patient expects to be discharged to:: Private residence Living Arrangements: Alone Available Help at Discharge: Family;Available 24 hours/day;Available PRN/intermittently Type of Home: House Home Access: Stairs to enter Entrance Stairs-Rails: None(can hold onto door frame) Entrance Stairs-Number of Steps: 2 Home Layout: One level Home Equipment: Cane - single point;Toilet riser;Shower seat      Prior Function Level of Independence: Independent         Comments: 1 fall in past 6 months.     Hand Dominance   Dominant Hand: Right    Extremity/Trunk Assessment   Upper Extremity Assessment Upper Extremity Assessment: Defer to OT evaluation    Lower Extremity Assessment Lower Extremity Assessment:  Generalized weakness    Cervical / Trunk Assessment Cervical / Trunk Assessment: (forward head/shoulders)  Communication   Communication: No difficulties  Cognition Arousal/Alertness: Awake/alert Behavior During Therapy: WFL for tasks  assessed/performed Overall Cognitive Status: Within Functional Limits for tasks assessed                                 General Comments: A&O x4      General Comments General comments (skin integrity, edema, etc.): Pt HR/spO2 mointored t/o sess. Remain WFLs. Pt denies adverse s/s during session.    Exercises Reviewed spinal precautions.   Assessment/Plan    PT Assessment Patient needs continued PT services  PT Problem List Decreased strength;Decreased activity tolerance;Decreased balance;Decreased mobility;Decreased knowledge of use of DME;Decreased knowledge of precautions;Pain;Decreased skin integrity       PT Treatment Interventions DME instruction;Gait training;Stair training;Functional mobility training;Therapeutic activities;Therapeutic exercise;Balance training;Patient/family education    PT Goals (Current goals can be found in the Care Plan section)  Acute Rehab PT Goals Patient Stated Goal: To go home and have less pain during my daily routines. PT Goal Formulation: With patient Time For Goal Achievement: 04/27/19 Potential to Achieve Goals: Good    Frequency BID   Barriers to discharge        Co-evaluation               AM-PAC PT "6 Clicks" Mobility  Outcome Measure Help needed turning from your back to your side while in a flat bed without using bedrails?: A Little Help needed moving from lying on your back to sitting on the side of a flat bed without using bedrails?: A Little Help needed moving to and from a bed to a chair (including a wheelchair)?: A Little Help needed standing up from a chair using your arms (e.g., wheelchair or bedside chair)?: A Little Help needed to walk in hospital room?: A Little Help needed climbing 3-5 steps with a railing? : A Little 6 Click Score: 18    End of Session Equipment Utilized During Treatment: Gait belt(up high away from incision/brace) Activity Tolerance: Patient tolerated treatment well Patient  left: in chair;with call bell/phone within reach;with chair alarm set;with SCD's reapplied;Other (comment)(B heels floating via pillow) Nurse Communication: Mobility status;Precautions(via white board) PT Visit Diagnosis: Unsteadiness on feet (R26.81);Muscle weakness (generalized) (M62.81);History of falling (Z91.81);Other abnormalities of gait and mobility (R26.89)    Time: 1035-1110 PT Time Calculation (min) (ACUTE ONLY): 35 min   Charges:   PT Evaluation $PT Eval Low Complexity: 1 Low PT Treatments $Therapeutic Activity: 8-22 mins       Leitha Bleak, PT 04/13/19, 12:00 PM

## 2019-04-13 NOTE — Evaluation (Signed)
Occupational Therapy Evaluation Patient Details Name: Carly Bell MRN: [id number] DOB: 06-13-1940 Today's Date: 04/13/2019    History of Present Illness Carly Bell is a 79 yo female who presented with anterolisthesis.  She failed conservative management and elected for surgical management. She is now s/p L4-5 lateral lumbar interbody fusion with posterior fixation at L4-5. PMH includes, DM II, OA, HLD, and benign HTN.   Clinical Impression   Pt seen for OT evaluation this date, POD#1 from lumbar fusion. Prior to hospital admission, pt was independent with mobility, ADL, and IADL, but endorses 1 fall in the past 6 months while sweeping. She states that she has been having increased difficulty with all aspects of mobility and ADL due to back pain, and is eager to get back to caring for her grandchildren and cooking Sunday lunches for her family. Pt lives alone in a single family home with 2 steps to enter at the side and no handrails. Pt reports her daughter is planning to be available 24 hours/day for the first few days after sx Currently pt is at supervision level with all aspects of mobility and min assist for heavy LB ADL task. Pt educated in back precautions, self care skills, bed mobility and functional transfer training, AE/DME for bathing, dressing, and toileting needs, and home/routines modifications and falls prevention strategies to maximize safety and functional independence while minimizing falls risk and maintaining precautions. Pt verbalized understanding of all education/training provided. Handout provided to support recall and carry over of learned precautions/techniques for bed mobility, functional transfers, and self care skills. Pt would benefit from continued OT services to maximize safety and recall of education provided. Do not anticipate additional OT needs upon hospital DC. Pt to follow surgeons recommendation for follow up therapy needs.       Follow Up Recommendations  No  OT follow up    Equipment Recommendations  3 in 1 bedside commode    Recommendations for Other Services       Precautions / Restrictions Precautions Precautions: Fall;Back Precaution Booklet Issued: Yes (comment) Precaution Comments: No bending, arching, twisting. Restrictions Weight Bearing Restrictions: No      Mobility Bed Mobility Overal bed mobility: Needs Assistance Bed Mobility: Rolling;Sidelying to Sit Rolling: Supervision Sidelying to sit: Supervision       General bed mobility comments: Pt comes to sitting at EOB using "log roll" technique. She requires supervision for safety/adherence to back precautions and min cueing for technique this date.  Transfers Overall transfer level: Needs assistance Equipment used: Rolling walker (2 wheeled) Transfers: Sit to/from [facility] Sit to Stand: Supervision Stand pivot transfers: Supervision       General transfer comment: Min cueing for hand placement during STS. Good confidence/safety awerness during SPT to room recliner.    Balance Overall balance assessment: Needs assistance Sitting-balance support: Feet supported;No upper extremity supported Sitting balance-Leahy Scale: Good Sitting balance - Comments: Steady static and dynamic sitting at EOB during functional tasks.     Standing balance-Leahy Scale: Good Standing balance comment: Pt able to maintain standing balance during functional dressing task w/o UE support.                           ADL either performed or assessed with clinical judgement   ADL Overall ADL's : Needs assistance/impaired  General ADL Comments: Pt is functionally limited by low back pain, decreased activity tolerance, and back precautions of no bending, arching, or twistin after sx. She dons underwear with modeling/min cueing for use of LHR and supervision for safety this date. Pt adheres to back  precautions well t/o the session and is able to don TLSO brace appropriately while sitting EOB prior to functional activities this date.     Vision Baseline Vision/History: Wears glasses Wears Glasses: Reading only;Distance only Patient Visual Report: No change from baseline       Perception     Praxis      Pertinent Vitals/Pain Pain Assessment: 0-10 Pain Score: 8  Pain Location: Low back/legs with mobility. Pain Descriptors / Indicators: Sore;Aching;Guarding Pain Intervention(s): Limited activity within patient's tolerance;Monitored during session;Repositioned;Utilized relaxation techniques;Patient requesting pain meds-RN notified     Hand Dominance Right   Extremity/Trunk Assessment Upper Extremity Assessment Upper Extremity Assessment: Overall WFL for tasks assessed(BUE grossly 4/5 t/o WFLS. Pt denies changes to sensation, [place], or AROM this date.)   Lower Extremity Assessment Lower Extremity Assessment: Generalized weakness       Communication Communication Communication: No difficulties   Cognition Arousal/Alertness: Awake/alert Behavior During Therapy: WFL for tasks assessed/performed Overall Cognitive Status: Within Functional Limits for tasks assessed                                 General Comments: Pt pleasant, conversational, A&O x4 t/o session.   General Comments  Pt HR/spO2 mointored t/o sess. Remain WFLs. Pt denies adverse s/s during session.    Exercises Other Exercises Other Exercises: Pt educated in falls prevention strategies, back precautions: no bending, arching, twisting, or lifting, safe use of AE/DME for ADL management and adherence to back precautions. and routines modifications to support safety and functional independence upon hospital DC.   Shoulder Instructions      Home Living Family/patient expects to be discharged to:: Private residence Living Arrangements: Alone Available Help at Discharge: Family;Available 24  hours/day;Available PRN/intermittently(Pt reports dtr will be available 24 hours/day for the first few days after sx. Otherwise pt has multiple children and grandchildren in the area who assist her regularly.) Type of Home: House Home Access: Stairs to enter [employer] of Steps: 2 Entrance Stairs-Rails: None Home Layout: One level     Bathroom Shower/Tub: Tub/shower unit;Door   Bathroom Toilet: Standard(With riser)     Home Equipment: Kasandra Knudsen - single point;Toilet riser;Shower seat          Prior Functioning/Environment Level of Independence: Independent        Comments: Pt independent with ADL/IADL tasks at baseline. she denies using an AD for functional mobility. She endorses 1 fall in the past 6 months.        OT Problem List: Decreased strength;Decreased activity tolerance;Decreased safety awareness;Impaired balance (sitting and/or standing);Decreased knowledge of use of DME or AE;Decreased knowledge of precautions      OT Treatment/Interventions: Therapeutic exercise;Therapeutic activities;DME and/or AE instruction;Patient/family education;Self-care/ADL training;Balance training    OT Goals(Current goals can be found in the care plan section) Acute Rehab OT Goals Patient Stated Goal: To go home and have less pain during my daily routines. OT Goal Formulation: With patient Time For Goal Achievement: 04/27/19 Potential to Achieve Goals: Good ADL Goals Pt Will Perform Lower Body Dressing: sit to/from stand;with set-up;with supervision;with adaptive equipment(With LRAD PRN for improved safety and functional independence upon hospital DC.) Pt Will Transfer to  Toilet: ambulating;bedside commode;with modified independence(With LRAD PRN for improved safety and functional independence upon hospital DC.) Pt Will Perform Toileting - Clothing Manipulation and hygiene: sit to/from stand;with modified independence(With LRAD PRN for improved safety and functional independence  upon hospital DC.) Additional ADL Goal #1: Pt will independently verbalize a plan to implement at least 3 learned falls prevention strategies into her daily routines/home environment for improved safety and functional independence upon hospital DC.  OT Frequency: Min 1X/week   Barriers to D/C:            Co-evaluation              AM-PAC OT "6 Clicks" Daily Activity     Outcome Measure Help from another person eating meals?: None Help from another person taking care of personal grooming?: None Help from another person toileting, which includes using toliet, bedpan, or urinal?: A Little Help from another person bathing (including washing, rinsing, drying)?: A Little Help from another person to put on and taking off regular upper body clothing?: None Help from another person to put on and taking off regular lower body clothing?: A Little 6 Click Score: 21   End of Session Equipment Utilized During Treatment: Gait belt;Rolling walker  Activity Tolerance: Patient tolerated treatment well Patient left: in chair;with chair alarm set;with SCD's reapplied  OT Visit Diagnosis: Other abnormalities of gait and mobility (R26.89);Pain Pain - Right/Left: (Both) Pain - part of body: (Low back)                Time: [id number] OT Time Calculation (min): 49 min Charges:  OT General Charges $OT Visit: 1 Visit OT Evaluation $OT Eval Moderate Complexity: 1 Mod OT Treatments $Self Care/Home Management : 38-52 mins  Shara Blazing, M.S., OTR/L Ascom: [phone] 04/13/19, 11:13 AM

## 2019-04-13 NOTE — Plan of Care (Signed)

## 2019-04-13 NOTE — Progress Notes (Signed)
Procedure: L4-5 XLIF Procedure date: 04/12/2019 Diagnosis: Anterolisthesis  History: Carly Bell is s/p L4-5 XLIF for anterolisthesis  POD1: She is recovering well this morning.  Pain was rated 0/10 except when she moved around.  Catheter was removed but she has not urinated yet.  She has not walked.  Eating breakfast without issue.  Update: Received PT and OT. Pain increased to 8/10.   POD0: Tolerated procedure well. Evaluated in post op recovery still disoriented from anesthesia but able to answer questions and obey commands.  Complains of back pain.  Denies any new lower extremity pain/numbness/tingling.  Physical Exam: Vitals:   04/13/19 0546 04/13/19 0820  BP: (!) 119/51 (!) 117/43  Pulse: 81 76  Resp: 17 16  Temp: 98.2 F (36.8 C) 97.9 F (36.6 C)  SpO2: 97% 97%   Strength: 5 out of 5 throughout lower extremities bilaterally sensation: Intact and symmetric throughout lower extremities bilaterally Skin: Glue intact at lateral incision site.  Dressing intact at lumbar incision sites.  Data:  No results for input(s): NA, K, CL, CO2, BUN, CREATININE, LABGLOM, GLUCOSE, CALCIUM in the last 168 hours. No results for input(s): AST, ALT, ALKPHOS in the last 168 hours.  Invalid input(s): TBILI   No results for input(s): WBC, HGB, HCT, PLT in the last 168 hours. No results for input(s): APTT, INR in the last 168 hours.       Other tests/results: Lumbar x-rays pending  Assessment/Plan:  Carly Bell is POD1 s/p L4-5 XLIF for anterolisthesis. Will continue to monitor  - mobilize - pain control - added toradol 15mg  q 6 hours for 5 dosees - DVT prophylaxis - PTOT  - Brace -patient supplied - Imaging -pending - Catheter care - catheter discontinued.  States baseline incontinent issues and requires a pad.  Marin Olp [name] Department of Neurosurgery

## 2019-04-13 NOTE — Progress Notes (Signed)
Physical Therapy Treatment Patient Details Name: Carly Bell MRN: [id number] DOB: July 06, 1940 Today's Date: 04/13/2019    History of Present Illness Pt is a 79 y.o. female s/p L4-5 XLIF for anterolisthesis (back and R leg pain symptoms) 04/12/19.  PMH includes htn, PVD, DM.    PT Comments    Pt resting in bed upon PT arrival.  Modified independent with bed mobility via logrolling and able to donn brace modified independently sitting edge of bed.  Pt declined RW and requesting to try walking without AD.  SBA with transfers and CGA with ambulation 200 feet (no AD).  Able to navigate 4 steps with B railings CGA.  Pt able to doff back brace modified independently sitting edge of bed end of session and laid down in bed modified independently via logrolling.  Pain 2-3/10 low back, R lateral thigh, and L hip end of session resting in bed.  Will continue to focus on strengthening and progressive functional mobility during hospitalization.    Follow Up Recommendations  Follow surgeon's recommendation for DC plan and follow-up therapies     Equipment Recommendations     Recommendations for Other Services       Precautions / Restrictions Precautions Precautions: Fall;Back Precaution Booklet Issued: Yes (comment) Precaution Comments: No bending, arching, twisting.  Brace when upright. Restrictions Weight Bearing Restrictions: No    Mobility  Bed Mobility Overal bed mobility: Modified Independent Bed Mobility: Rolling;Sidelying to Sit;Sit to Sidelying Rolling: Modified independent (Device/Increase time) Sidelying to sit: Modified independent (Device/Increase time)     Sit to sidelying: Modified independent (Device/Increase time) General bed mobility comments: mild increased effort to perform on own  Transfers Overall transfer level: Needs assistance Equipment used: None Transfers: Sit to/from Stand Sit to Stand: Supervision         General transfer comment: fairly strong stand  from bed x1 trial; controlled descent noted sitting down on bed  Ambulation/Gait Ambulation/Gait assistance: Min guard Gait Distance (Feet): 240 Feet Assistive device: None Gait Pattern/deviations: Step-through pattern Gait velocity: mildly decreased   General Gait Details: mild increased B lateral sway; steady   Stairs Stairs: Yes Stairs assistance: Min guard Stair Management: Two rails;Step to pattern;Forwards Number of Stairs: 4 General stair comments: steady safe stairs navigation   Wheelchair Mobility    Modified Rankin (Stroke Patients Only)       Balance Overall balance assessment: Needs assistance Sitting-balance support: Feet supported;No upper extremity supported Sitting balance-Leahy Scale: Good Sitting balance - Comments: steady sitting reaching within BOS     Standing balance-Leahy Scale: Good Standing balance comment: steady standing reaching within BOS                            Cognition Arousal/Alertness: Awake/alert Behavior During Therapy: WFL for tasks assessed/performed Overall Cognitive Status: Within Functional Limits for tasks assessed                                 General Comments: A&O x4      Exercises      General Comments General comments (skin integrity, edema, etc.): No drainage noted lumbar dressing beginning/end of session      Pertinent Vitals/Pain Pain Assessment: 0-10 Pain Score: 3  Pain Location: low back and R lateral thigh and L hip Pain Descriptors / Indicators: Sore;Aching;Guarding Pain Intervention(s): Limited activity within patient's tolerance;Monitored during session;Premedicated before session;Repositioned  Home Living Family/patient expects to be discharged to:: Private residence Living Arrangements: Alone Available Help at Discharge: Family;Available 24 hours/day;Available PRN/intermittently Type of Home: House Home Access: Stairs to enter Entrance Stairs-Rails: None(can hold  onto door frame) Home Layout: One level Home Equipment: Cane - single point;Toilet riser;Shower seat      Prior Function Level of Independence: Independent      Comments: 1 fall in past 6 months.   PT Goals (current goals can now be found in the care plan section) Acute Rehab PT Goals Patient Stated Goal: To go home and have less pain during my daily routines. PT Goal Formulation: With patient Time For Goal Achievement: 04/27/19 Potential to Achieve Goals: Good    Frequency    BID      PT Plan Current plan remains appropriate    Co-evaluation              AM-PAC PT "6 Clicks" Mobility   Outcome Measure  Help needed turning from your back to your side while in a flat bed without using bedrails?: None Help needed moving from lying on your back to sitting on the side of a flat bed without using bedrails?: None Help needed moving to and from a bed to a chair (including a wheelchair)?: A Little Help needed standing up from a chair using your arms (e.g., wheelchair or bedside chair)?: A Little Help needed to walk in hospital room?: A Little Help needed climbing 3-5 steps with a railing? : A Little 6 Click Score: 20    End of Session Equipment Utilized During Treatment: Gait belt;Back brace(up high away from incision/brace) Activity Tolerance: Patient tolerated treatment well Patient left: in bed;with call bell/phone within reach;with bed alarm set;with SCD's reapplied;Other (comment)(B heels floating via pillow) Nurse Communication: Mobility status;Precautions PT Visit Diagnosis: Unsteadiness on feet (R26.81);Muscle weakness (generalized) (M62.81);History of falling (Z91.81);Other abnormalities of gait and mobility (R26.89)     Time: 1440-1510 PT Time Calculation (min) (ACUTE ONLY): 30 min  Charges:  $Gait Training: 8-22 mins $Therapeutic Activity: 8-22 mins                    Leitha Bleak, PT 04/13/19, 3:34 PM

## 2019-04-14 MED ORDER — METHOCARBAMOL 500 MG PO TABS: 500.0000 mg | ORAL_TABLET | Freq: Four times a day (QID) | ORAL | 0 refills | Status: DC | PRN

## 2019-04-14 MED ORDER — OXYCODONE HCL 5 MG PO TABS: 5.0000 mg | ORAL_TABLET | ORAL | 0 refills | Status: DC | PRN

## 2019-04-14 MED ORDER — SENNA 8.6 MG PO TABS: 1.0000 | ORAL_TABLET | Freq: Two times a day (BID) | ORAL | 0 refills | Status: AC

## 2019-04-14 NOTE — Progress Notes (Signed)
Physical Therapy Treatment Patient Details Name: Carly Bell MRN: [id number] DOB: 11/07/1940 Today's Date: 04/14/2019    History of Present Illness Pt is a 79 y.o. female s/p L4-5 XLIF for anterolisthesis (back and R leg pain symptoms) 04/12/19.  PMH includes htn, PVD, DM.    PT Comments    Pt able to donn/doff back brace modified independently; also modified independent with bed mobility (via logrolling), independent with transfers, and SBA with ambulation 360 feet (no AD).  Pt steady without loss of balance with session's activities.  Pt declined to trial stairs this morning (pt reporting she felt good with stairs trial yesterday and had no questions/concerns with stairs).  Pain 6/10 low back end of session resting in recliner.  Pt able to verbalize and follow back precautions during sessions activities.    Follow Up Recommendations  Follow surgeon's recommendation for DC plan and follow-up therapies     Equipment Recommendations  None recommended by PT    Recommendations for Other Services       Precautions / Restrictions Precautions Precautions: Fall;Back Precaution Booklet Issued: Yes (comment) Precaution Comments: No bending, arching, twisting.  Brace when upright. Restrictions Weight Bearing Restrictions: No    Mobility  Bed Mobility Overal bed mobility: Modified Independent Bed Mobility: Rolling;Sidelying to Sit;Sit to Sidelying Rolling: Modified independent (Device/Increase time) Sidelying to sit: Modified independent (Device/Increase time)     Sit to sidelying: Modified independent (Device/Increase time) General bed mobility comments: mild increased effort to perform on own; bed flat  Transfers Overall transfer level: Needs assistance Equipment used: None Transfers: Sit to/from Stand Sit to Stand: Independent Stand pivot transfers: Independent       General transfer comment: sit to stand from recliner, toilet (with grab bar use), and bed; no vc's  required; steady  Ambulation/Gait Ambulation/Gait assistance: Supervision Gait Distance (Feet): 360 Feet Assistive device: None   Gait velocity: mildly decreased   General Gait Details: mild increased B lateral sway; steady   Stairs Stairs: (Deferred: pt reports no questions/concerns with stairs)           Wheelchair Mobility    Modified Rankin (Stroke Patients Only)       Balance Overall balance assessment: Modified Independent Sitting-balance support: Feet supported;No upper extremity supported Sitting balance-Leahy Scale: Normal Sitting balance - Comments: steady sitting reaching outside BOS     Standing balance-Leahy Scale: Good Standing balance comment: steady standing reaching within BOS                            Cognition Arousal/Alertness: Awake/alert Behavior During Therapy: WFL for tasks assessed/performed Overall Cognitive Status: Within Functional Limits for tasks assessed                                 General Comments: A&O x4      Exercises      General Comments  Pt agreeable to PT session.      Pertinent Vitals/Pain Pain Assessment: 0-10 Pain Score: 6  Pain Location: low back and R lateral thigh and L hip Pain Descriptors / Indicators: Aching;Sore Pain Intervention(s): Limited activity within patient's tolerance;Monitored during session;Repositioned(pt reports RN planning to bring in pain meds)  Vitals (HR and O2 on room air) stable and WFL throughout treatment session.    Home Living  Prior Function            PT Goals (current goals can now be found in the care plan section) Acute Rehab PT Goals Patient Stated Goal: To go home and have less pain during my daily routines. PT Goal Formulation: With patient Time For Goal Achievement: 04/27/19 Potential to Achieve Goals: Good Progress towards PT goals: Progressing toward goals    Frequency    BID      PT Plan  Current plan remains appropriate    Co-evaluation              AM-PAC PT "6 Clicks" Mobility   Outcome Measure  Help needed turning from your back to your side while in a flat bed without using bedrails?: None Help needed moving from lying on your back to sitting on the side of a flat bed without using bedrails?: None Help needed moving to and from a bed to a chair (including a wheelchair)?: None Help needed standing up from a chair using your arms (e.g., wheelchair or bedside chair)?: None Help needed to walk in hospital room?: A Little Help needed climbing 3-5 steps with a railing? : A Little 6 Click Score: 22    End of Session Equipment Utilized During Treatment: Gait belt;Back brace Activity Tolerance: Patient tolerated treatment well Patient left: in chair;with call bell/phone within reach;with chair alarm set;with SCD's reapplied;Other (comment)(B heels floating via pillow) Nurse Communication: Mobility status;Precautions PT Visit Diagnosis: Unsteadiness on feet (R26.81);Muscle weakness (generalized) (M62.81);History of falling (Z91.81);Other abnormalities of gait and mobility (R26.89)     Time: [id number] PT Time Calculation (min) (ACUTE ONLY): 30 min  Charges:  $Therapeutic Exercise: 8-22 mins $Therapeutic Activity: 8-22 mins                     Leitha Bleak, PT 04/14/19, 12:31 PM

## 2019-04-14 NOTE — Progress Notes (Signed)
Physical Therapy Treatment Patient Details Name: Carly Bell MRN: [id number] DOB: 12/24/1940 Today's Date: 04/14/2019    History of Present Illness Pt is a 79 y.o. female s/p L4-5 XLIF for anterolisthesis (back and R leg pain symptoms) 04/12/19.  PMH includes htn, PVD, DM.    PT Comments    Pt resting in bed upon PT arrival.  Pt modified independent with bed mobility, independent with transfers, and independent with ambulation 360 feet no AD.  No loss of balance noted during sessions activities.  Pain 4/10 low back resting in bed end of session.  Able to donn/doff back brace modified independently.  Reviewed spinal precautions: pt verbalizing appropriate understanding.  Pt appears safe to discharge home once medically appropriate.    Follow Up Recommendations  Follow surgeon's recommendation for DC plan and follow-up therapies     Equipment Recommendations  None recommended by PT    Recommendations for Other Services       Precautions / Restrictions Precautions Precautions: Fall;Back Precaution Booklet Issued: Yes (comment) Precaution Comments: No bending, arching, twisting.  Brace when upright. Restrictions Weight Bearing Restrictions: No    Mobility  Bed Mobility Overal bed mobility: Modified Independent Bed Mobility: Rolling;Sidelying to Sit;Sit to Sidelying Rolling: Modified independent (Device/Increase time) Sidelying to sit: Modified independent (Device/Increase time)     Sit to sidelying: Modified independent (Device/Increase time) General bed mobility comments: mild increased effort to perform on own  Transfers Overall transfer level: Independent Equipment used: None Transfers: Sit to/from [employer] to Stand: Independent Stand pivot transfers: Independent       General transfer comment: sit to/from stand from bed and toilet (with grab bar use); steady  Ambulation/Gait Ambulation/Gait assistance: Independent Gait Distance (Feet):  360 Feet Assistive device: None Gait Pattern/deviations: Step-through pattern Gait velocity: mildly decreased   General Gait Details: mild increased B lateral sway; steady   Stairs             Wheelchair Mobility    Modified Rankin (Stroke Patients Only)       Balance Overall balance assessment: Modified Independent Sitting-balance support: Feet supported;No upper extremity supported Sitting balance-Leahy Scale: Normal Sitting balance - Comments: steady sitting reaching outside BOS     Standing balance-Leahy Scale: Good Standing balance comment: steady standing reaching within BOS and steady with functional mobility                            Cognition Arousal/Alertness: Awake/alert Behavior During Therapy: WFL for tasks assessed/performed Overall Cognitive Status: Within Functional Limits for tasks assessed                                 General Comments: A&O x4      Exercises      General Comments  Pt agreeable to PT session.      Pertinent Vitals/Pain Pain Assessment: 0-10 Pain Score: 4  Pain Location: low back and R lateral thigh and L hip Pain Descriptors / Indicators: Aching;Sore Pain Intervention(s): Limited activity within patient's tolerance;Monitored during session;Repositioned    Home Living                      Prior Function            PT Goals (current goals can now be found in the care plan section) Acute Rehab PT Goals Patient Stated Goal: To  go home and have less pain during my daily routines. PT Goal Formulation: With patient Time For Goal Achievement: 04/27/19 Potential to Achieve Goals: Good Progress towards PT goals: Progressing toward goals    Frequency    BID      PT Plan Current plan remains appropriate    Co-evaluation              AM-PAC PT "6 Clicks" Mobility   Outcome Measure  Help needed turning from your back to your side while in a flat bed without using  bedrails?: None Help needed moving from lying on your back to sitting on the side of a flat bed without using bedrails?: None Help needed moving to and from a bed to a chair (including a wheelchair)?: None Help needed standing up from a chair using your arms (e.g., wheelchair or bedside chair)?: None Help needed to walk in hospital room?: None Help needed climbing 3-5 steps with a railing? : A Little 6 Click Score: 23    End of Session Equipment Utilized During Treatment: Gait belt;Back brace Activity Tolerance: Patient tolerated treatment well Patient left: in bed;with call bell/phone within reach;with bed alarm set;with SCD's reapplied Nurse Communication: Mobility status;Precautions PT Visit Diagnosis: Unsteadiness on feet (R26.81);Muscle weakness (generalized) (M62.81);History of falling (Z91.81);Other abnormalities of gait and mobility (R26.89)     Time: 1550-1610 PT Time Calculation (min) (ACUTE ONLY): 20 min  Charges:   $Therapeutic Activity: 8-22 mins                     Leitha Bleak, PT 04/14/19, 4:25 PM

## 2019-04-14 NOTE — Plan of Care (Signed)
  Problem: Education: Goal: Knowledge of General Education information will improve Description: Including pain rating scale, medication(s)/side effects and non-pharmacologic comfort measures Outcome: Progressing   Problem: Clinical Measurements: Goal: Ability to maintain clinical measurements within normal limits will improve Outcome: Progressing Goal: Will remain free from infection Outcome: Progressing   Problem: Activity: Goal: Risk for activity intolerance will decrease Outcome: Progressing   Problem: Coping: Goal: Level of anxiety will decrease Outcome: Progressing   Problem: Pain Managment: Goal: General experience of comfort will improve Outcome: Progressing   Problem: Safety: Goal: Ability to remain free from injury will improve Outcome: Progressing   Problem: Skin Integrity: Goal: Risk for impaired skin integrity will decrease Outcome: Progressing   

## 2019-04-14 NOTE — Progress Notes (Signed)
Procedure: L4-5 XLIF Procedure date: 04/12/2019 Diagnosis: Anterolisthesis  History: Carly Bell is s/p L4-5 XLIF for anterolisthesis  POD 2: She continues to do well.  Pain rated 4/10 at incision and left anterior thigh.  Pain on the right lower extremity has resolved.  She has ambulated and and walk steps with physical therapy successfully.  Voiding and eating without issue.  She has not had a bowel movement but she is passing gas.  POD1: She is recovering well this morning.  Pain was rated 0/10 except when she moved around.  Catheter was removed but she has not urinated yet.  She has not walked.  Eating breakfast without issue.  Update: Received PT and OT. Pain increased to 8/10.   POD0: Tolerated procedure well. Evaluated in post op recovery still disoriented from anesthesia but able to answer questions and obey commands.  Complains of back pain.  Denies any new lower extremity pain/numbness/tingling.  Physical Exam: Vitals:   04/14/19 1156 04/14/19 1533  BP: (!) 142/54 (!) 137/54  Pulse: 77 80  Resp: 17 17  Temp: 98.4 F (36.9 C) 99.1 F (37.3 C)  SpO2: 100% 100%   Strength: 5 out of 5 throughout lower extremities bilaterally sensation: Intact and symmetric throughout lower extremities bilaterally Skin: Glue intact at lateral incision site.  Dressing intact at lumbar incision sites.  Data:  No results for input(s): NA, K, CL, CO2, BUN, CREATININE, LABGLOM, GLUCOSE, CALCIUM in the last 168 hours. No results for input(s): AST, ALT, ALKPHOS in the last 168 hours.  Invalid input(s): TBILI   No results for input(s): WBC, HGB, HCT, PLT in the last 168 hours. No results for input(s): APTT, INR in the last 168 hours.       Other tests/results:  EXAM: LUMBAR SPINE - 2-3 VIEW 04/14/2019  COMPARISON:  April 12, 2019.  FINDINGS: Status post surgical posterior fusion of L4-5 with bilateral intrapedicular screw placement and interbody fusion. Good alignment of vertebral  bodies is noted.  IMPRESSION: Status post surgical posterior fusion of L4-5.  Assessment/Plan:  KATRIANA Bell is POD2 s/p L4-5 XLIF for anterolisthesis.  Ambulated, voided, and eating without issue.  She is ready to go home.  We will continue postop pain control with the pain medication, muscle relaxer, Celebrex, and Tylenol as needed.  Recommended stool softener while taking pain medication.  She is scheduled to follow-up in clinic in approximately 2 weeks to monitor progress.  Marin Olp [name] Department of Neurosurgery

## 2019-04-14 NOTE — Discharge Summary (Signed)
Procedure: L4-5 XLIF Procedure date: 04/12/2019 Diagnosis: Anterolisthesis  History: Carly Bell is s/p L4-5 XLIF for anterolisthesis  POD 2: She continues to do well.  Pain rated 4/10 at incision and left anterior thigh.  Pain on the right lower extremity has resolved.  She has ambulated and and walk steps with physical therapy successfully.  Voiding and eating without issue.  She has not had a bowel movement but she is passing gas.  POD1: She is recovering well this morning.  Pain was rated 0/10 except when she moved around.  Catheter was removed but she has not urinated yet.  She has not walked.  Eating breakfast without issue.  Update: Received PT and OT. Pain increased to 8/10.   POD0: Tolerated procedure well. Evaluated in post op recovery still disoriented from anesthesia but able to answer questions and obey commands.  Complains of back pain.  Denies any new lower extremity pain/numbness/tingling.  Physical Exam: Vitals:   04/14/19 1156 04/14/19 1533  BP: (!) 142/54 (!) 137/54  Pulse: 77 80  Resp: 17 17  Temp: 98.4 F (36.9 C) 99.1 F (37.3 C)  SpO2: 100% 100%   Strength: 5 out of 5 throughout lower extremities bilaterally sensation: Intact and symmetric throughout lower extremities bilaterally Skin: Glue intact at lateral incision site.  Dressing intact at lumbar incision sites.  Data:  No results for input(s): NA, K, CL, CO2, BUN, CREATININE, LABGLOM, GLUCOSE, CALCIUM in the last 168 hours. No results for input(s): AST, ALT, ALKPHOS in the last 168 hours.  Invalid input(s): TBILI   No results for input(s): WBC, HGB, HCT, PLT in the last 168 hours. No results for input(s): APTT, INR in the last 168 hours.       Other tests/results:  EXAM: LUMBAR SPINE - 2-3 VIEW 04/14/2019  COMPARISON:  April 12, 2019.  FINDINGS: Status post surgical posterior fusion of L4-5 with bilateral intrapedicular screw placement and interbody fusion. Good alignment of vertebral  bodies is noted.  IMPRESSION: Status post surgical posterior fusion of L4-5.  Assessment/Plan:  Carly Bell is POD2 s/p L4-5 XLIF for anterolisthesis.  Ambulated, voided, and eating without issue.  She is ready to go home.  We will continue postop pain control with the pain medication, muscle relaxer, Celebrex, and Tylenol as needed.  Recommended stool softener while taking pain medication.  She is scheduled to follow-up in clinic in approximately 2 weeks to monitor progress.  Marin Olp [name] Department of Neurosurgery

## 2019-04-14 NOTE — TOC Progression Note (Signed)
Transition of Care [facility]) - Progression Note    Patient Details  Name: Carly Bell MRN: [id number] Date of Birth: 12/19/1940  Transition of Care [facility]) CM/SW Contact  Vaanya Shambaugh, Gardiner Rhyme, LCSW Phone Number: 04/14/2019, 11:37 AM  Clinical Narrative:   Met with pt who reports she has no equipment needs. Has access to them already. [facility] can provide follow up PT at home. Pt doing better today and moving better with therapies. Will continue to work on discharge plan.  Daughter to provide care at discharge, here daily    Expected Discharge Plan: [place] Barriers to Discharge: Continued Medical Work up  Expected Discharge Plan and Services Expected Discharge Plan: [place] In-house Referral: Clinical Social Work     Living arrangements for the past 2 months: Single Family Home                                       Social Determinants of Health (SDOH) Interventions    Readmission Risk Interventions No flowsheet data found.

## 2019-04-14 NOTE — Care Management Important Message (Signed)
Important Message  Patient Details  Name: YOUSRA Bell MRN: [id number] Date of Birth: December 30, 1940   Medicare Important Message Given:  Yes     Juliann Pulse A [name] 04/14/2019, 2:14 PM

## 2019-04-14 NOTE — Plan of Care (Signed)
Progressing with current plan of care. Up with physical therapy, ambulating in hallway with brace on.

## 2019-04-14 NOTE — Plan of Care (Signed)
Discharged to home

## 2019-04-14 NOTE — Discharge Instructions (Signed)
Your surgeon has performed an operation on your lumbar spine (low back) to fuse two or more of the vertebrae (bones) together. This procedure is performed to treat a number of different spinal problems, including narrowing of the spinal canal (stenosis), herniated discs, degenerative changes, and injuries.   Many times, patients feel better immediately after surgery and can "overdo it." Even if you feel well, it is important that you follow these activity guidelines. If you do not let your back heal properly from the surgery, you can increase the chance of return of your symptoms and other complications. The following are instructions to help in your recovery once you have been discharged from the hospital.   * Do not take anti-inflammatory medications for 3 months after surgery (naproxen [Aleve], ibuprofen [Advil, Motrin], etc.). These medications can prevent your bones from healing properly.    -Continue taking Celebrex, Tylenol, and muscle relaxer for pain management.  Oxycodone provided for breakthrough pain.  Please take only as needed. Activity     No bending, lifting, or twisting ("BLT"). Avoid lifting objects heavier than 10 pounds (gallon milk jug).  Where possible, avoid household activities that involve lifting, bending, reaching, pushing, or pulling such as laundry, vacuuming, grocery shopping, and childcare. Try to arrange for help from friends and family for these activities while your back heals.   Increase physical activity slowly as tolerated.  Taking short walks is encouraged, but avoid strenuous exercise. Do not jog, run, bicycle, lift weights, or participate in any other exercises unless specifically allowed by your doctor. Avoid prolonged sitting, including car rides.   Talk to your doctor before resuming sexual activity.   You should not drive until cleared by your doctor.   Until released by your doctor, you should not return to work or school.  You should rest at home and  let your body heal.   You may shower three days after your surgery.  After showering, lightly dab your incision dry. Do not take a tub bath or go swimming until approved by your doctor at your follow-up appointment.   If your doctor ordered a lumbar brace for you, you should wear it whenever you are out of bed. You may remove it when lying down or sleeping. You should also wear it when riding in a car. Not all back surgeries require a lumbar brace.   If you smoke, we strongly recommend that you quit.  Smoking has been proven to interfere with normal bone healing and will dramatically reduce the success rate of your surgery. Please contact [facility] (800-QUIT-NOW) and use the resources at www.[facility].com for assistance in stopping smoking.   Surgical Incision   If you have a dressing on your incision, remove it 2 days after your surgery. Keep your incision area clean and dry.   If you have staples or stitches on your incision, you should have a follow up scheduled for removal. If you do not have staples or stitches, you will have steri-strips (small pieces of surgical tape) or Dermabond glue. The steri-strips/glue should begin to peel away within about a week (it is fine if the steri-strips fall off before then). If the strips are still in place one week after your surgery, you may gently remove them.   Diet           You may return to your usual diet. Be sure to stay hydrated.   When to Contact us   Although your surgery and recovery will likely be uneventful, you  may have some residual numbness, aches, and pains in your back and/or legs. This is normal and should improve in the next few weeks.   However, should you experience any of the following, contact us immediately:   - New numbness or weakness   - Pain that is progressively getting worse, and is not relieved by your pain medications or rest   - Bleeding, redness, swelling, pain, or drainage from surgical incision   - Chills or  flu-like symptoms   - Fever greater than 101.0 F (38.3 C)   - Problems with bowel or bladder functions   - Difficulty breathing or shortness of breath   - Warmth, tenderness, or swelling in your calf   Contact Information   - During office hours (Monday-Friday 9 am to 5 pm), please call your physician at [phone]  - After hours and weekends, please call [phone] and an answering service will put you in touch with either Dr. Lacinda Axon or Dr. Izora Ribas.   - For a life-threatening emergency, call 911

## 2019-08-06 ENCOUNTER — Emergency Department: Payer: Medicare HMO

## 2019-08-06 ENCOUNTER — Emergency Department
Admission: EM | Admit: 2019-08-06 | Discharge: 2019-08-06 | Disposition: A | Discharge status: Home / Self Care | Payer: Medicare HMO | Attending: Emergency Medicine | Admitting: Emergency Medicine

## 2019-08-06 ENCOUNTER — Encounter: Payer: Self-pay | Admitting: Emergency Medicine

## 2019-08-06 ENCOUNTER — Other Ambulatory Visit: Payer: Self-pay

## 2019-08-06 DIAGNOSIS — Z79899 Other long term (current) drug therapy: Secondary | ICD-10-CM | POA: Diagnosis not present

## 2019-08-06 DIAGNOSIS — E1149 Type 2 diabetes mellitus with other diabetic neurological complication: Secondary | ICD-10-CM | POA: Insufficient documentation

## 2019-08-06 DIAGNOSIS — M25551 Pain in right hip: Secondary | ICD-10-CM | POA: Diagnosis present

## 2019-08-06 DIAGNOSIS — M1611 Unilateral primary osteoarthritis, right hip: Secondary | ICD-10-CM | POA: Diagnosis not present

## 2019-08-06 DIAGNOSIS — I10 Essential (primary) hypertension: Secondary | ICD-10-CM | POA: Diagnosis not present

## 2019-08-06 MED ORDER — LIDOCAINE 5 % EX PTCH: 1.0000 | MEDICATED_PATCH | Freq: Two times a day (BID) | CUTANEOUS | 0 refills | Status: AC

## 2019-08-06 MED ORDER — OXYCODONE-ACETAMINOPHEN 5-325 MG PO TABS
1.0000 | ORAL_TABLET | Freq: Once | ORAL | Status: AC
  Administered 2019-08-06: 1 via ORAL
  Filled 2019-08-06: qty 1

## 2019-08-06 MED ORDER — LIDOCAINE 5 % EX PTCH
1.0000 | MEDICATED_PATCH | Freq: Once | CUTANEOUS | Status: DC
  Administered 2019-08-06: 1 via TRANSDERMAL
  Filled 2019-08-06: qty 1

## 2019-08-06 MED ORDER — OXYCODONE-ACETAMINOPHEN 5-325 MG PO TABS: 1.0000 | ORAL_TABLET | Freq: Four times a day (QID) | ORAL | 0 refills | Status: AC | PRN

## 2019-08-06 NOTE — Discharge Instructions (Signed)
Follow-up with your orthopedist if any continued problems with your right hip.  Again taking the Percocet every 6 hours if needed for pain.  This medication can cause drowsiness and increase your risk for falling.  The Lidoderm patch is also to help with pain.  This medication will not cause any drowsiness.  Also use ice to your hip to see if this helps better than the heat.  You may continue taking your regular medication as prescribed by your doctor.

## 2019-08-06 NOTE — ED Notes (Signed)
Pt verbalized understanding of discharge instructions. NAD at this time. 

## 2019-08-06 NOTE — ED Notes (Signed)
Pt ambulatory to ED Flex room with some difficulty. Pt states pain has been ongoing for "a while" and she was seen by her PCP on Thurs where she received a "shot" without relief. Pt states "pain has increased over the last few days".

## 2019-08-06 NOTE — ED Triage Notes (Signed)
Pt presents to ED via POV with c/o chronic R hip pain, pt states was seen at PCP on Thursday and received Cortisone shot for hip pain. Pt states increasing pain since cortisone shot. Pt ambulatory to triage from parking lot without difficulty.

## 2019-08-06 NOTE — ED Provider Notes (Signed)
[facility] Provider Note  ____________________________________________   First MD Initiated Contact with Patient 08/06/19 [phone]     (approximate)  I have reviewed the triage vital signs and the nursing notes.   HISTORY  Chief Complaint Hip Pain   HPI Carly Bell is a 79 y.o. female presents to the ED with complaint of chronic right hip pain.  Patient states that she saw her PCP 3 days ago and received a cortisone injection.  Patient states that she has had increased pain since the cortisone shot.  She denies any recent injury.  She rates her pain as a 10/10.      Past Medical History:  Diagnosis Date  . Arthritis   . Cardiac murmur Diagnosed in 2007  . Diabetes mellitus without complication ([place])   . Diverticulosis   . GERD (gastroesophageal reflux disease)   . Hyperlipidemia   . Hypertension   . Lower extremity edema   . PVD (peripheral vascular disease) [facility])     Patient Active Problem List   Diagnosis Date Noted  . S/P lumbar fusion 04/12/2019  . Degeneration of intervertebral disc of lumbar region 10/18/2014  . Lumbar canal stenosis 04/19/2014  . Bursitis, trochanteric 04/19/2014  . Morbid obesity ([place]) 11/04/2013  . Type 2 diabetes mellitus with other diabetic neurological complication ([place]) 72/62/0355  . Generalized OA 07/08/2013  . Acid reflux 07/08/2013  . Benign hypertension 07/08/2013  . HLD (hyperlipidemia) 07/08/2013    Past Surgical History:  Procedure Laterality Date  . ANTERIOR LATERAL LUMBAR FUSION WITH PERCUTANEOUS SCREW 1 LEVEL N/A 04/12/2019   Procedure: L4-5 LATERAL LUMBAR INTERBODY FUSION WITH POSTERIOR FIXATION AT L4-5;  Surgeon: Meade Maw, MD;  Location: [facility] ORS;  Service: Neurosurgery;  Laterality: N/A;  . BREAST BIOPSY Left 1990's   neg- Done in Radiology  . CHOLECYSTECTOMY  2008  . COLON SURGERY     bowel obstruction  . COLONOSCOPY W/ POLYPECTOMY  07/2008   Dr. Rochel Brome    . COLONOSCOPY WITH PROPOFOL N/A 12/25/2014   Procedure: COLONOSCOPY WITH PROPOFOL;  Surgeon: Lollie Sails, MD;  Location: [facility] ENDOSCOPY;  Service: Endoscopy;  Laterality: N/A;  . COLONOSCOPY WITH PROPOFOL N/A 10/27/2016   Procedure: COLONOSCOPY WITH PROPOFOL;  Surgeon: Lollie Sails, MD;  Location: [facility] ENDOSCOPY;  Service: Endoscopy;  Laterality: N/A;  . CYSTOSCOPY WITH STENT PLACEMENT Right 04/11/2015   Procedure: CYSTOSCOPY WITH STENT PLACEMENT;  Surgeon: Hollice Espy, MD;  Location: [facility] ORS;  Service: Urology;  Laterality: Right;  . DILATION AND CURETTAGE OF UTERUS  Unsure of Date  . EYE SURGERY Bilateral 2012   [facility]  . LAPAROSCOPIC RIGHT COLECTOMY Right 04/11/2015   Procedure: LAPAROSCOPIC RIGHT COLECTOMY, REMOVAL OF RIGHT URETERAL STENT;  Surgeon: Hubbard Robinson, MD;  Location: [facility] ORS;  Service: General;  Laterality: Right;  . TONSILLECTOMY  as child    Prior to Admission medications   Medication Sig Start Date End Date Taking? Authorizing Provider  acetaminophen (TYLENOL) 500 MG tablet Take 1,000 mg by mouth every 8 (eight) hours as needed for moderate pain or headache.    [provider]  atorvastatin (LIPITOR) 80 MG tablet Take 80 mg by mouth at bedtime.     [provider]  celecoxib (CELEBREX) 100 MG capsule Take 100 mg by mouth 2 (two) times daily.    [provider]  Cholecalciferol (VITAMIN D3) 1000 units CAPS Take 1,000 Units by mouth daily.  [provider]  fosinopril (MONOPRIL) 10 MG tablet Take 10 mg by mouth 2 (two) times daily.     [provider]  gabapentin (NEURONTIN) 300 MG capsule Take 300 mg by mouth in the morning and at bedtime.    [provider]  glimepiride (AMARYL) 2 MG tablet Take 2 mg by mouth daily with breakfast.    [provider]  lidocaine (LIDODERM) 5 % Place 1 patch onto the skin every 12 (twelve) hours. Remove & Discard patch  within 12 hours or as directed by MD>  right hip area 08/06/19 08/05/20  [name], [name]  omeprazole (PRILOSEC) 40 MG capsule Take 40 mg by mouth every morning.     [provider]  oxyCODONE-acetaminophen (PERCOCET) 5-325 MG tablet Take 1 tablet by mouth every 6 (six) hours as needed for severe pain. 08/06/19 08/05/20  [name], [name]  senna (SENOKOT) 8.6 MG TABS tablet Take 1 tablet (8.6 mg total) by mouth 2 (two) times daily. 04/14/19   Marin Olp, [name]    Allergies Codeine  Family History  Problem Relation Age of Onset  . Prostate cancer Father   . Alzheimer's disease Mother   . Diabetes Sister   . Diabetes Brother   . Breast cancer Neg Hx     Social History Social History   Tobacco Use  . Smoking status: Never Smoker  . Smokeless tobacco: Never Used  Vaping Use  . Vaping Use: Never used  Substance Use Topics  . Alcohol use: No  . Drug use: No    Review of Systems Constitutional: No fever/chills Eyes: No visual changes. Cardiovascular: Denies chest pain. Respiratory: Denies shortness of breath. Gastrointestinal:  No nausea, no vomiting. Musculoskeletal: Positive for right hip pain. Skin: Negative for rash. Neurological: Negative for headaches, focal weakness or numbness. {____________________________________________   PHYSICAL EXAM:  VITAL SIGNS: ED Triage Vitals  Enc Vitals Group     BP 08/06/19 0741 (!) 179/65     Pulse Rate 08/06/19 0741 (!) 59     Resp 08/06/19 0741 16     Temp 08/06/19 0741 98.1 F (36.7 C)     Temp Source 08/06/19 0741 Oral     SpO2 08/06/19 0741 97 %     Weight 08/06/19 0742 240 lb (108.9 kg)     Height 08/06/19 0742 5\' 7"  (1.702 m)     Head Circumference --      Peak Flow --      Pain Score 08/06/19 0748 10     Pain Loc --      Pain Edu? --      Excl. in [place]? --     Constitutional: Alert and oriented. Well appearing and in no acute distress. Eyes: Conjunctivae are normal. PERRL. EOMI. Head:  Atraumatic. Neck: No stridor.   Cardiovascular: Normal rate, regular rhythm. Grossly normal heart sounds.  Good peripheral circulation. Respiratory: Normal respiratory effort.  No retractions. Lungs CTAB. Gastrointestinal: Soft and nontender. No distention.  Musculoskeletal: Positive right hip pain. Neurologic:  Normal speech and language. No gross focal neurologic deficits are appreciated.  Skin:  Skin is warm, dry and intact. No rash noted. Psychiatric: Mood and affect are normal. Speech and behavior are normal.  ____________________________________________   LABS (all labs ordered are listed, but only abnormal results are displayed)  Labs Reviewed - No data to display  RADIOLOGY  ED MD interpretation:   X-ray image was reviewed.  Official radiology report(s): DG Hip Unilat W or  Wo Pelvis 2-3 Views Right  Result Date: 08/06/2019 CLINICAL DATA:  Pain EXAM: DG HIP (WITH OR WITHOUT PELVIS) 2-3V RIGHT COMPARISON:  None. FINDINGS: Frontal pelvis as well as frontal and lateral right hip images were obtained. No fracture or dislocation. There is mild symmetric narrowing of each hip joint. No erosive change. There is a small bone island in the superior left acetabulum. There is postoperative change in the lower cervical region. IMPRESSION: Mild symmetric narrowing of each hip joint. No fracture or dislocation. Electronically Signed   By: Lowella Grip III M.D.   On: 08/06/2019 09:30    ____________________________________________   PROCEDURES  Procedure(s) performed (including Critical Care):  Procedures   ____________________________________________   INITIAL IMPRESSION / ASSESSMENT AND PLAN / ED COURSE  As part of my medical decision making, I reviewed the following data within the electronic MEDICAL RECORD NUMBER Notes from prior ED visits and [place] Controlled Substance Database  79 year old female presents to the ED with complaint of right hip pain.  Patient states that she got  a cortisone injection 3 days ago by her PCP but has not had any relief.  Patient reports that she has not had any x-rays.  X-ray here in the ED shows mild symmetric narrowing of each hip joint.  Patient was given Percocet while in the ED with some relief.  Patient has had this before and had no difficulty with it.  A prescription for the same was sent to her pharmacy along with a Lidoderm patch.  A Lidoderm patch was applied to her right hip prior to discharge.  Patient is encouraged to follow-up with her PCP or orthopedist if any continued problems.  ____________________________________________   FINAL CLINICAL IMPRESSION(S) / ED DIAGNOSES  Final diagnoses:  Right hip pain  Primary osteoarthritis of right hip     ED Discharge Orders         Ordered    oxyCODONE-acetaminophen (PERCOCET) 5-325 MG tablet  Every 6 hours PRN     Discontinue  Reprint     08/06/19 1018    lidocaine (LIDODERM) 5 %  Every 12 hours     Discontinue  Reprint     08/06/19 1018           Note:  This document was prepared using Dragon voice recognition software and may include unintentional dictation errors.    [name], [name] 08/06/19 1113    Nena Polio, MD 08/06/19 1440

## 2019-08-16 ENCOUNTER — Other Ambulatory Visit: Payer: Self-pay | Admitting: Sports Medicine

## 2019-08-16 DIAGNOSIS — M25551 Pain in right hip: Secondary | ICD-10-CM

## 2019-08-16 DIAGNOSIS — M76891 Other specified enthesopathies of right lower limb, excluding foot: Secondary | ICD-10-CM

## 2019-08-16 DIAGNOSIS — M1611 Unilateral primary osteoarthritis, right hip: Secondary | ICD-10-CM

## 2019-08-18 ENCOUNTER — Other Ambulatory Visit: Payer: Self-pay

## 2019-08-18 ENCOUNTER — Ambulatory Visit
Admission: RE | Admit: 2019-08-18 | Discharge: 2019-08-18 | Disposition: A | Discharge status: Home / Self Care | Payer: Medicare HMO | Source: Ambulatory Visit | Attending: Sports Medicine | Admitting: Sports Medicine

## 2019-08-18 DIAGNOSIS — M1611 Unilateral primary osteoarthritis, right hip: Secondary | ICD-10-CM | POA: Diagnosis present

## 2019-08-18 DIAGNOSIS — M25551 Pain in right hip: Secondary | ICD-10-CM | POA: Insufficient documentation

## 2019-08-18 DIAGNOSIS — M76891 Other specified enthesopathies of right lower limb, excluding foot: Secondary | ICD-10-CM

## 2019-08-24 ENCOUNTER — Ambulatory Visit: Payer: Medicare HMO | Attending: Sports Medicine | Admitting: Physical Therapy

## 2019-08-24 ENCOUNTER — Other Ambulatory Visit: Payer: Self-pay

## 2019-08-24 ENCOUNTER — Encounter: Payer: Self-pay | Admitting: Physical Therapy

## 2019-08-24 DIAGNOSIS — M6281 Muscle weakness (generalized): Secondary | ICD-10-CM

## 2019-08-24 DIAGNOSIS — M25551 Pain in right hip: Secondary | ICD-10-CM | POA: Insufficient documentation

## 2019-08-24 DIAGNOSIS — R293 Abnormal posture: Secondary | ICD-10-CM | POA: Diagnosis present

## 2019-08-24 NOTE — Therapy (Signed)
[place] [facility] [address]. [place], [place], [address] Phone: [phone]   Fax:  [phone]  Physical Therapy Evaluation  Patient Details  Name: Carly Bell MRN: [id number] Date of Birth: [birth year] Referring Provider (PT): Rosalia Hammers   Encounter Date: 08/24/2019   PT End of Session - 08/24/19 1021    Visit Number 1    Number of Visits 16    Date for PT Re-Evaluation 10/19/19    Authorization Type IE 08/24/2019    PT Start Time 0900    PT Stop Time 0955    PT Time Calculation (min) 55 min    Activity Tolerance Patient tolerated treatment well    Behavior During Therapy [facility] for tasks assessed/performed           Past Medical History:  Diagnosis Date  . Arthritis   . Cardiac murmur Diagnosed in 2007  . Diabetes mellitus without complication ([place])   . Diverticulosis   . GERD (gastroesophageal reflux disease)   . Hyperlipidemia   . Hypertension   . Lower extremity edema   . PVD (peripheral vascular disease) ([place])     Past Surgical History:  Procedure Laterality Date  . ANTERIOR LATERAL LUMBAR FUSION WITH PERCUTANEOUS SCREW 1 LEVEL N/A 04/12/2019   Procedure: L4-5 LATERAL LUMBAR INTERBODY FUSION WITH POSTERIOR FIXATION AT L4-5;  Surgeon: Meade Maw, MD;  Location: [facility] ORS;  Service: Neurosurgery;  Laterality: N/A;  . BREAST BIOPSY Left 1990's   neg- Done in Radiology  . CHOLECYSTECTOMY  2008  . COLON SURGERY     bowel obstruction  . COLONOSCOPY W/ POLYPECTOMY  07/2008   Dr. Rochel Brome  . COLONOSCOPY WITH PROPOFOL N/A 12/25/2014   Procedure: COLONOSCOPY WITH PROPOFOL;  Surgeon: Lollie Sails, MD;  Location: [facility] ENDOSCOPY;  Service: Endoscopy;  Laterality: N/A;  . COLONOSCOPY WITH PROPOFOL N/A 10/27/2016   Procedure: COLONOSCOPY WITH PROPOFOL;  Surgeon: Lollie Sails, MD;  Location: [facility] ENDOSCOPY;  Service: Endoscopy;  Laterality: N/A;  . CYSTOSCOPY WITH STENT PLACEMENT Right 04/11/2015    Procedure: CYSTOSCOPY WITH STENT PLACEMENT;  Surgeon: Hollice Espy, MD;  Location: [facility] ORS;  Service: Urology;  Laterality: Right;  . DILATION AND CURETTAGE OF UTERUS  Unsure of Date  . EYE SURGERY Bilateral 2012   Cataract Removal- [facility]  . LAPAROSCOPIC RIGHT COLECTOMY Right 04/11/2015   Procedure: LAPAROSCOPIC RIGHT COLECTOMY, REMOVAL OF RIGHT URETERAL STENT;  Surgeon: Hubbard Robinson, MD;  Location: [facility] ORS;  Service: General;  Laterality: Right;  . TONSILLECTOMY  as child    There were no vitals filed for this visit.        [facility] PT Assessment - 08/24/19 0001      Assessment   Medical Diagnosis R adductor magnus strain    Referring Provider (PT) Rosalia Hammers    Onset Date/Surgical Date 04/24/19    Hand Dominance Right    Prior Therapy Yes      Balance Screen   Has the patient fallen in the past 6 months No          PELVIC HEALTH PHYSICAL THERAPY EVALUATION  SCREENING Red Flags: None Have you had any night sweats? Unexplained weight loss? Saddle anesthesia? Unexplained changes in bowel or bladder habits?  Precautions: None  SUBJECTIVE  Chief Complaint: Patient notes that her R hip pain is limiting her at home. She has pain in the groin and at the back of the hip. She notes difficulty with performing heavy  tasks around the house including preparing Sunday dinners for her family. Patient has had to give up babysitting her great-grandchildren as a result of the pain she feels in her R hip. Patient denies any constipation, but notes consistently loose stools. Patient also notes that she has some urgency urinary incontinence which she wears pads to manage. Patient notes that she feels comfortable in sitting but tends to get up and down throughout the day so does not know if sitting is limited. Patient notes the pain is nagging constantly, but increases with transfers. Patient notes that she cannot lift her legs to wash them in the shower, and  has limited tolerance to standing in shower and has to sit to dry after.   Patient notes a catch in the groin on the R with sit to stand transfer.   Easing factors: sitting, rest  Aggravating factors: standing, walking, squatting, bending  Pertinent History:  MD Note:  "History of Present Illness:   Carly Bell is a 79 y.o. female that presents to clinic today for follow up evaluation and management of right hip pain possibly due to mild hip arthritis versus iliopsoas tendinitis/bursitis. She had been evaluated by Dr. Izora Ribas on 07/12/2019 and not felt to be having symptoms related to her lumbar spine. They were last evaluated by myself on 07/13/2019. At that time, the plan was to refer to physical therapy, continue gabapentin/acetaminophen/anti-inflammatories, and follow-up. She has been doing physical therapy at [facility] without much improvement. She reports pain along her right anterior hip and groin and can have some pain on her lateral hip as well. She denies any radiation of symptoms into her feet but sometimes can have symptoms in her right thigh. She still has symptoms brought on by hip flexion. She currently rates her pain severity is 10/10. She reports associated feeling of hip catching, feeling of hip instability, limping, numbness and tingling over the area of pain, pain at night. She denies associated swelling, weakness, fevers or chills, night sweats, weight loss, skin color change. She has tried acetaminophen, celecoxib, gabapentin in addition to physical therapy with persistent symptoms."    Obstetrical History: G2P2  Urinary History: Incontinence: Positive. Triggers: urgency Amount: Min.  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   Location of pain: R posterior hip and groin Current pain:  5/10  Max pain:  9-10/10 Least pain:  2-3/10 Pain quality: pain quality: aching, nagging Radiating pain: Yes ; numbness to the calf, greatest in the thigh  Current activities:  Sitting, puzzles, watching television  Patient Goals:  Be able to sweep the floor, stand and wash dishes, take care of great-grandbabies   OBJECTIVE  Mental Status Patient is oriented to person, place and time.  Recent memory is intact.  Remote memory is intact.  Attention span and concentration are intact.  Expressive speech is intact.  Patient's fund of knowledge is within normal limits for educational level.  POSTURE/OBSERVATIONS:  Lumbar lordosis: WNL Iliac crest height: L elevated Lumbar lateral shift: negative Pelvic obliquity: R Leg length discrepancy: WNL; but on standing 2/2 to pelvic obliquity and elevation RLE >LLE  GAIT: **Wide based gait = spinal stenosis (+LR 12) Trendelenburg R: Negative L: Positive  RANGE OF MOTION:    LEFT RIGHT  Lumbar forward flexion (65):  WFL    Lumbar extension (30): WFL (pain in R hip posterior)    Lumbar lateral flexion (25):  48 cm 48 cm  Thoracic and Lumbar rotation (30 degrees):    WFL 50% of L with pain  Hip Flexion (0-125):   WFL 75% of L  Hip IR (0-45):  WFL 20  Hip ER (0-45):  WFL 30  Hip Abduction (0-40):  25 15    SENSATION: Grossly intact to light touch bilateral LEs as determined by testing dermatomes L2-S2 Proprioception and hot/cold testing deferred on this date  STRENGTH: MMT   RLE LLE  Hip Flexion 3+ 4  Hip Extension 3 4  Hip Abduction (seated) 5 5  Hip Adduction (seated) 5 5  Hip ER  5 5  Hip IR  3 4  Knee Extension 4 5  Knee Flexion 5 5  Dorsiflexion  5 5  Plantarflexion (seated) 5 5    SPECIAL TESTS: Slump (SN 83, -LR 0.32): R: Positive L: Negative SLR (SN 92, -LR 0.29): R: Positive L:   Negative FABER (SN 81): R: Negative L: Negative FADIR (SN 94): R: Positive L: Negative Hip scour (SN 50): R: Positive L: Negative  PHYSICAL PERFORMANCE MEASURES: STS: BUE support on thighs, pause halfway 2/2 to catching in R groin   EXTERNAL PELVIC EXAM: deferred 2/2 to time and patient comfort Palpation: Breath coordination: Cued Lengthen: Cued Contraction: Cough:    OUTCOME MEASURES: FOTO (31)   ASSESSMENT Patient is a 79 year old presenting to clinic with chief complaints of R groin and hip pain exacerbated by movement. Upon examination, patient demonstrates deficits in posture, R hip ROM, R hip strength, pain, balance, and activity tolerance as evidenced by patient's inability to stand > 3 minutes before pain intensity increases, elevated L iliac crest and R pelvic obliquity, R hip rotational components ~ 50% of normative values, R hip MMT grossly 3/5, 9/10 pain in the past week. Patient had positive R Slump and R SLR for burning and numbness symptoms into the R calf, as well. Patient's responses on FOTO outcome measures (31) indicate significant functional limitations/disability/distress. Patient's progress may be limited due to arthritic changes and presence of labral pathology (ref: diagnostic imaging); however, patient's motivation is advantageous. Patient was able to achieve mild relief of R groin pain with long-axis distraction during today's evaluation and responded positively to educational interventions. Patient will benefit from continued skilled therapeutic intervention to address deficits in posture, R hip ROM, R hip strength, pain, balance, and activity tolerance in order to increase function and improve overall QOL.  EDUCATION Patient educated on prognosis, POC, and provided with HEP including: sciatic nerve glides. Patient articulated understanding and returned demonstration. Patient will benefit from further education in order to maximize compliance and understanding for  long-term therapeutic gains.  TREATMENT Manual Therapy: LAD of R hip with gentle oscillations for improved joint nutrition and decreased muscle spasm          Objective measurements completed on examination: See above findings.            PT Long Term Goals - 08/24/19 1049      PT LONG TERM GOAL #1   Title Patient will demonstrate independence with HEP in order to maximize therapeutic gains and improve carryover from physical therapy sessions to ADLs in the home and community.    Baseline IE: provided    Time 8    Period Weeks    Status New    Target Date 10/19/19  PT LONG TERM GOAL #2   Title Patient will demonstrate improved function as evidenced by a score of 52 on FOTO measure for full participation in activities at home and in the community.    Baseline IE: 31    Time 8    Period Weeks    Status New    Target Date 10/19/19      PT LONG TERM GOAL #3   Title Patient will decrease worst pain as reported on NPRS by at least 2 points to demonstrate clinically significant reduction in pain in order to restore/improve function and overall QOL.    Baseline IE: 9/10    Time 8    Period Weeks    Status New    Target Date 10/19/19      PT LONG TERM GOAL #4   Title Patient will rate standing for an hour and walking a mile as "moderate difficulty/a little bit of difficulty" in order to participate more fully in community activities.    Baseline IE: "quite a bit of difficulty"    Time 8    Period Weeks    Status New    Target Date 10/19/19      PT LONG TERM GOAL #5   Title Patient will be able to sweep floors of home without rest break and with proper body mechanics in order to return to PLOF.    Baseline IE: unable    Time 8    Period Weeks    Status New    Target Date 10/19/19                  Plan - 08/24/19 1022    Clinical Impression Statement Patient is a 79 year old presenting to clinic with chief complaints of R groin and hip pain  exacerbated by movement. Upon examination, patient demonstrates deficits in posture, R hip ROM, R hip strength, pain, balance, and activity tolerance as evidenced by patient's inability to stand > 3 minutes before pain intensity increases, elevated L iliac crest and R pelvic obliquity, R hip rotational components ~ 50% of normative values, R hip MMT grossly 3/5, 9/10 pain in the past week. Patient had positive R Slump and R SLR for burning and numbness symptoms into the R calf, as well. Patient's responses on FOTO outcome measures (31) indicate significant functional limitations/disability/distress. Patient's progress may be limited due to arthritic changes and presence of labral pathology (ref: diagnostic imaging); however, patient's motivation is advantageous. Patient was able to achieve mild relief of R groin pain with long-axis distraction during today's evaluation and responded positively to educational interventions. Patient will benefit from continued skilled therapeutic intervention to address deficits in posture, R hip ROM, R hip strength, pain, balance, and activity tolerance in order to increase function and improve overall QOL.    Personal Factors and Comorbidities Age;Behavior Pattern;Comorbidity 3+;Past/Current Experience;Fitness;Time since onset of injury/illness/exacerbation    Comorbidities DM 2, HLD, HTN, OA, acide reflux    Examination-Activity Limitations Bathing;Dressing;Transfers;Squat;Lift;Bend;Stairs;Stand;Continence    Examination-Participation Restrictions Interpersonal Relationship;Yard Work;Cleaning;Laundry;Driving;Community Activity    Stability/Clinical Decision Making Evolving/Moderate complexity    Clinical Decision Making Moderate    Rehab Potential Fair    PT Frequency 2x / week    PT Duration 8 weeks    PT Treatment/Interventions ADLs/Self Care Home Management;Aquatic Therapy;Moist Heat;Cryotherapy;Electrical Stimulation;Therapeutic activities;Neuromuscular  re-education;Functional mobility training;Stair training;Gait training;Therapeutic exercise;Balance training;Patient/family education;Manual techniques;Taping;Passive range of motion;Dry needling;Joint Manipulations;Spinal Manipulations    PT Next Visit Plan manual (obturator internus assessment), postural re-education  PT Home Exercise Plan R sciatic nerve glides    Consulted and Agree with Plan of Care Patient           Patient will benefit from skilled therapeutic intervention in order to improve the following deficits and impairments:  Abnormal gait, Decreased balance, Decreased endurance, Decreased mobility, Difficulty walking, Hypomobility, Obesity, Pain, Postural dysfunction, Improper body mechanics, Impaired flexibility, Decreased strength, Decreased coordination, Decreased activity tolerance, Decreased range of motion  Visit Diagnosis: Pain in right hip - Plan: PT plan of care cert/re-cert  Muscle weakness (generalized) - Plan: PT plan of care cert/re-cert  Abnormal posture - Plan: PT plan of care cert/re-cert     Problem List Patient Active Problem List   Diagnosis Date Noted  . S/P lumbar fusion 04/12/2019  . Degeneration of intervertebral disc of lumbar region 10/18/2014  . Lumbar canal stenosis 04/19/2014  . Bursitis, trochanteric 04/19/2014  . Morbid obesity ([place]) 11/04/2013  . Type 2 diabetes mellitus with other diabetic neurological complication ([place]) 02/03/3233  . Generalized OA 07/08/2013  . Acid reflux 07/08/2013  . Benign hypertension 07/08/2013  . HLD (hyperlipidemia) 07/08/2013   Myles Gip PT, DPT [phone] 08/24/2019, 10:55 AM  [facility] [facility] [address] [place], [place], [address] Phone: [phone]   Fax:  [phone]  Name: Carly Bell MRN: [id number] Date of Birth: 1941-01-24

## 2019-08-29 ENCOUNTER — Ambulatory Visit: Payer: Medicare HMO | Attending: Sports Medicine | Admitting: Physical Therapy

## 2019-08-29 ENCOUNTER — Encounter: Payer: Self-pay | Admitting: Physical Therapy

## 2019-08-29 ENCOUNTER — Other Ambulatory Visit: Payer: Self-pay

## 2019-08-29 DIAGNOSIS — M25551 Pain in right hip: Secondary | ICD-10-CM | POA: Insufficient documentation

## 2019-08-29 DIAGNOSIS — M6281 Muscle weakness (generalized): Secondary | ICD-10-CM | POA: Diagnosis present

## 2019-08-29 DIAGNOSIS — R293 Abnormal posture: Secondary | ICD-10-CM | POA: Insufficient documentation

## 2019-08-29 NOTE — Therapy (Signed)
[facility] [address] [place], [place], [address] Phone: [phone]   Fax:  [phone]  Physical Therapy Treatment  Patient Details  Name: Carly Bell MRN: [id number] Date of Birth: August 17, 1940 Referring Provider (PT): Rosalia Hammers   Encounter Date: 08/29/2019   PT End of Session - 08/29/19 0952    Visit Number 2    Number of Visits 16    Date for PT Re-Evaluation 10/19/19    Authorization Type IE 08/24/2019    PT Start Time 0947    PT Stop Time 1045    PT Time Calculation (min) 58 min    Activity Tolerance Patient tolerated treatment well    Behavior During Therapy [facility] for tasks assessed/performed           Past Medical History:  Diagnosis Date  . Arthritis   . Cardiac murmur Diagnosed in 2007  . Diabetes mellitus without complication ([place])   . Diverticulosis   . GERD (gastroesophageal reflux disease)   . Hyperlipidemia   . Hypertension   . Lower extremity edema   . PVD (peripheral vascular disease) ([place])     Past Surgical History:  Procedure Laterality Date  . ANTERIOR LATERAL LUMBAR FUSION WITH PERCUTANEOUS SCREW 1 LEVEL N/A 04/12/2019   Procedure: L4-5 LATERAL LUMBAR INTERBODY FUSION WITH POSTERIOR FIXATION AT L4-5;  Surgeon: Meade Maw, MD;  Location: [facility] ORS;  Service: Neurosurgery;  Laterality: N/A;  . BREAST BIOPSY Left 1990's   neg- Done in Radiology  . CHOLECYSTECTOMY  2008  . COLON SURGERY     bowel obstruction  . COLONOSCOPY W/ POLYPECTOMY  07/2008   Dr. Rochel Brome  . COLONOSCOPY WITH PROPOFOL N/A 12/25/2014   Procedure: COLONOSCOPY WITH PROPOFOL;  Surgeon: Lollie Sails, MD;  Location: [facility] ENDOSCOPY;  Service: Endoscopy;  Laterality: N/A;  . COLONOSCOPY WITH PROPOFOL N/A 10/27/2016   Procedure: COLONOSCOPY WITH PROPOFOL;  Surgeon: Lollie Sails, MD;  Location: [facility] ENDOSCOPY;  Service: Endoscopy;  Laterality: N/A;  . CYSTOSCOPY WITH STENT PLACEMENT Right 04/11/2015    Procedure: CYSTOSCOPY WITH STENT PLACEMENT;  Surgeon: Hollice Espy, MD;  Location: [facility] ORS;  Service: Urology;  Laterality: Right;  . DILATION AND CURETTAGE OF UTERUS  Unsure of Date  . EYE SURGERY Bilateral 2012   Cataract Removal- [facility]  . LAPAROSCOPIC RIGHT COLECTOMY Right 04/11/2015   Procedure: LAPAROSCOPIC RIGHT COLECTOMY, REMOVAL OF RIGHT URETERAL STENT;  Surgeon: Hubbard Robinson, MD;  Location: [facility] ORS;  Service: General;  Laterality: Right;  . TONSILLECTOMY  as child    There were no vitals filed for this visit.   Subjective Assessment - 08/29/19 0950    Subjective Patient notes that she continues to have pain in the posterior hip. She was able to do some light grocery shopping without much issue while using the buggy. She does note that she has not had as much pain inthe groin since her evaluation.    Currently in Pain? Yes    Pain Score 8     Pain Location Hip    Pain Orientation Posterior           TREATMENT  Pre-treatment assessment: increased mm tension on R posterior hip  Manual Therapy: STM and TPR performed to R gluteal complex and sacral ligaments to allow for decreased tension and pain and improved posture and function using virbatory and percussive device R sacral border mobilization to allow for improved mobility and function, grade III R sacral border  mobilizations with movement (R clamshell) for decreased spasm and improved mobility, grade III  Neuromuscular Re-education: Sidelying R clamshell for improved mobility of R sacral border Seated anterior-posterior pelvic tilts for improved lumbar and sacral mobility Seated lateral pelvic tilts for improved lumbar and sacral mobility Seated heel squeezes for improved force closure of SIJ and symmetry in mm tone    Patient educated throughout session on appropriate technique and form using multi-modal cueing, HEP, and activity modification. Patient articulated understanding and returned  demonstration.  Patient Response to interventions: 3-4/10 pain walking out of clinic  ASSESSMENT Patient presents to clinic with excellent motivation to participate in therapy. Patient demonstrates deficits in posture, R hip ROM, R hip strength, pain, balance, and activity tolerance. Patient able to achieve significant pain reduction in R posterior hip with combined soft tissue and joint mobilization during today's session and responded positively to active interventions. Patient will benefit from continued skilled therapeutic intervention to address remaining deficits in posture, R hip ROM, R hip strength, pain, balance, and activity tolerance in order to increase function and improve overall QOL.    PT Long Term Goals - 08/24/19 1049      PT LONG TERM GOAL #1   Title Patient will demonstrate independence with HEP in order to maximize therapeutic gains and improve carryover from physical therapy sessions to ADLs in the home and community.    Baseline IE: provided    Time 8    Period Weeks    Status New    Target Date 10/19/19      PT LONG TERM GOAL #2   Title Patient will demonstrate improved function as evidenced by a score of 52 on FOTO measure for full participation in activities at home and in the community.    Baseline IE: 31    Time 8    Period Weeks    Status New    Target Date 10/19/19      PT LONG TERM GOAL #3   Title Patient will decrease worst pain as reported on NPRS by at least 2 points to demonstrate clinically significant reduction in pain in order to restore/improve function and overall QOL.    Baseline IE: 9/10    Time 8    Period Weeks    Status New    Target Date 10/19/19      PT LONG TERM GOAL #4   Title Patient will rate standing for an hour and walking a mile as "moderate difficulty/a little bit of difficulty" in order to participate more fully in community activities.    Baseline IE: "quite a bit of difficulty"    Time 8    Period Weeks    Status New     Target Date 10/19/19      PT LONG TERM GOAL #5   Title Patient will be able to sweep floors of home without rest break and with proper body mechanics in order to return to PLOF.    Baseline IE: unable    Time 8    Period Weeks    Status New    Target Date 10/19/19                 Plan - 08/29/19 5027    Clinical Impression Statement Patient presents to clinic with excellent motivation to participate in therapy. Patient demonstrates deficits in posture, R hip ROM, R hip strength, pain, balance, and activity tolerance. Patient able to achieve significant pain reduction in R posterior hip with combined soft  tissue and joint mobilization during today's session and responded positively to active interventions. Patient will benefit from continued skilled therapeutic intervention to address remaining deficits in posture, R hip ROM, R hip strength, pain, balance, and activity tolerance in order to increase function and improve overall QOL.    Personal Factors and Comorbidities Age;Behavior Pattern;Comorbidity 3+;Past/Current Experience;Fitness;Time since onset of injury/illness/exacerbation    Comorbidities DM 2, HLD, HTN, OA, acide reflux    Examination-Activity Limitations Bathing;Dressing;Transfers;Squat;Lift;Bend;Stairs;Stand;Continence    Examination-Participation Restrictions Interpersonal Relationship;Yard Work;Cleaning;Laundry;Driving;Community Activity    Stability/Clinical Decision Making Evolving/Moderate complexity    Rehab Potential Fair    PT Frequency 2x / week    PT Duration 8 weeks    PT Treatment/Interventions ADLs/Self Care Home Management;Aquatic Therapy;Moist Heat;Cryotherapy;Electrical Stimulation;Therapeutic activities;Neuromuscular re-education;Functional mobility training;Stair training;Gait training;Therapeutic exercise;Balance training;Patient/family education;Manual techniques;Taping;Passive range of motion;Dry needling;Joint Manipulations;Spinal Manipulations     PT Next Visit Plan manual (obturator internus assessment), postural re-education    PT Home Exercise Plan R sciatic nerve glides    Consulted and Agree with Plan of Care Patient           Patient will benefit from skilled therapeutic intervention in order to improve the following deficits and impairments:  Abnormal gait, Decreased balance, Decreased endurance, Decreased mobility, Difficulty walking, Hypomobility, Obesity, Pain, Postural dysfunction, Improper body mechanics, Impaired flexibility, Decreased strength, Decreased coordination, Decreased activity tolerance, Decreased range of motion  Visit Diagnosis: Pain in right hip  Muscle weakness (generalized)  Abnormal posture     Problem List Patient Active Problem List   Diagnosis Date Noted  . S/P lumbar fusion 04/12/2019  . Degeneration of intervertebral disc of lumbar region 10/18/2014  . Lumbar canal stenosis 04/19/2014  . Bursitis, trochanteric 04/19/2014  . Morbid obesity ([place]) 11/04/2013  . Type 2 diabetes mellitus with other diabetic neurological complication ([place]) 29/93/7169  . Generalized OA 07/08/2013  . Acid reflux 07/08/2013  . Benign hypertension 07/08/2013  . HLD (hyperlipidemia) 07/08/2013   Myles Gip PT, DPT [phone] 08/29/2019, 10:56 AM  [place] [facility] [address] [place], [place], [address] Phone: [phone]   Fax:  [phone]  Name: Carly Bell MRN: [id number] Date of Birth: 12-Mar-1940

## 2019-08-31 ENCOUNTER — Other Ambulatory Visit: Payer: Self-pay

## 2019-08-31 ENCOUNTER — Encounter: Payer: Self-pay | Admitting: Physical Therapy

## 2019-08-31 ENCOUNTER — Ambulatory Visit: Payer: Medicare HMO | Admitting: Physical Therapy

## 2019-08-31 DIAGNOSIS — M25551 Pain in right hip: Secondary | ICD-10-CM | POA: Diagnosis not present

## 2019-08-31 DIAGNOSIS — R293 Abnormal posture: Secondary | ICD-10-CM

## 2019-08-31 DIAGNOSIS — M6281 Muscle weakness (generalized): Secondary | ICD-10-CM

## 2019-08-31 NOTE — Therapy (Signed)
[facility] [address] [place], [place], [address] Phone: [phone]   Fax:  [phone]  Physical Therapy Treatment  Patient Details  Name: Carly Bell MRN: [id number] Date of Birth: 03/16/1940 Referring Provider (PT): Rosalia Hammers   Encounter Date: 08/31/2019   PT End of Session - 08/31/19 0952    Visit Number 3    Number of Visits 16    Date for PT Re-Evaluation 10/19/19    Authorization Type IE 08/24/2019    PT Start Time 0955    PT Stop Time 1050    PT Time Calculation (min) 55 min    Activity Tolerance Patient tolerated treatment well    Behavior During Therapy [facility] for tasks assessed/performed           Past Medical History:  Diagnosis Date  . Arthritis   . Cardiac murmur Diagnosed in 2007  . Diabetes mellitus without complication ([place])   . Diverticulosis   . GERD (gastroesophageal reflux disease)   . Hyperlipidemia   . Hypertension   . Lower extremity edema   . PVD (peripheral vascular disease) ([place])     Past Surgical History:  Procedure Laterality Date  . ANTERIOR LATERAL LUMBAR FUSION WITH PERCUTANEOUS SCREW 1 LEVEL N/A 04/12/2019   Procedure: L4-5 LATERAL LUMBAR INTERBODY FUSION WITH POSTERIOR FIXATION AT L4-5;  Surgeon: Meade Maw, MD;  Location: [facility] ORS;  Service: Neurosurgery;  Laterality: N/A;  . BREAST BIOPSY Left 1990's   neg- Done in Radiology  . CHOLECYSTECTOMY  2008  . COLON SURGERY     bowel obstruction  . COLONOSCOPY W/ POLYPECTOMY  07/2008   Dr. Rochel Brome  . COLONOSCOPY WITH PROPOFOL N/A 12/25/2014   Procedure: COLONOSCOPY WITH PROPOFOL;  Surgeon: Lollie Sails, MD;  Location: [facility] ENDOSCOPY;  Service: Endoscopy;  Laterality: N/A;  . COLONOSCOPY WITH PROPOFOL N/A 10/27/2016   Procedure: COLONOSCOPY WITH PROPOFOL;  Surgeon: Lollie Sails, MD;  Location: [facility] ENDOSCOPY;  Service: Endoscopy;  Laterality: N/A;  . CYSTOSCOPY WITH STENT PLACEMENT Right 04/11/2015    Procedure: CYSTOSCOPY WITH STENT PLACEMENT;  Surgeon: Hollice Espy, MD;  Location: [facility] ORS;  Service: Urology;  Laterality: Right;  . DILATION AND CURETTAGE OF UTERUS  Unsure of Date  . EYE SURGERY Bilateral 2012   C[facility]act Removal- [facility]  . LAPAROSCOPIC RIGHT COLECTOMY Right 04/11/2015   Procedure: LAPAROSCOPIC RIGHT COLECTOMY, REMOVAL OF RIGHT URETERAL STENT;  Surgeon: Hubbard Robinson, MD;  Location: [facility] ORS;  Service: General;  Laterality: Right;  . TONSILLECTOMY  as child    There were no vitals filed for this visit.   Subjective Assessment - 08/31/19 0957    Subjective Patient notes that she is doing a bit better today. She states that the pain is not that intense. She is resting better at night. Patient notes continued front of hip pain/pull when reaching overhead on the right. Patient otherwise does not have front sided R hip pain. Patient has more pain at the back of the R hip.    Currently in Pain? Yes    Pain Score 5     Pain Location Hip    Pain Orientation Right;Posterior             TREATMENT  Pre-treatment assessment: increased mm tension on R posterior hip  Manual Therapy: STM and TPR performed to R gluteal complex and sacral ligaments to allow for decreased tension and pain and improved posture and function using virbatory and percussive  device STM and TPR performed to R anterior thigh to allow for decreased tension and pain and improved posture and function R sacral border mobilization to allow for improved mobility and function, grade III R sacral border mobilizations with movement (R clamshell) for decreased spasm and improved mobility, grade III  Neuromuscular Re-education: Sidelying R clamshell for improved mobility of R sacral border Seated anterior-posterior pelvic tilts for improved lumbar and sacral mobility Seated lateral pelvic tilts for improved lumbar and sacral mobility Seated heel squeezes for improved force closure of SIJ  and symmetry in mm tone Sidelying R ITB stretch for improved postural awareness and decreased R hip pain  Patient educated throughout session on appropriate technique and form using multi-modal cueing, HEP, and activity modification. Patient articulated understanding and returned demonstration.  Patient Response to interventions: 2-3/10 pain walking out of clinic  ASSESSMENT Patient presents to clinic with excellent motivation to participate in therapy. Patient demonstrates deficits in posture, R hip ROM, R hip strength, pain, balance, and activity tolerance. Patient able to achieve significant pain reduction in R posterior hip with combined soft tissue and joint mobilization as well as decreased R anterior hip symptoms during today's session and responded positively to active interventions. Patient will benefit from continued skilled therapeutic intervention to address remaining deficits in posture, R hip ROM, R hip strength, pain, balance, and activity tolerance in order to increase function and improve overall QOL.      PT Long Term Goals - 08/24/19 1049      PT LONG TERM GOAL #1   Title Patient will demonstrate independence with HEP in order to maximize therapeutic gains and improve carryover from physical therapy sessions to ADLs in the home and community.    Baseline IE: provided    Time 8    Period Weeks    Status New    Target Date 10/19/19      PT LONG TERM GOAL #2   Title Patient will demonstrate improved function as evidenced by a score of 52 on FOTO measure for full participation in activities at home and in the community.    Baseline IE: 31    Time 8    Period Weeks    Status New    Target Date 10/19/19      PT LONG TERM GOAL #3   Title Patient will decrease worst pain as reported on NPRS by at least 2 points to demonstrate clinically significant reduction in pain in order to restore/improve function and overall QOL.    Baseline IE: 9/10    Time 8    Period Weeks     Status New    Target Date 10/19/19      PT LONG TERM GOAL #4   Title Patient will rate standing for an hour and walking a mile as "moderate difficulty/a little bit of difficulty" in order to participate more fully in community activities.    Baseline IE: "quite a bit of difficulty"    Time 8    Period Weeks    Status New    Target Date 10/19/19      PT LONG TERM GOAL #5   Title Patient will be able to sweep floors of home without rest break and with proper body mechanics in order to return to PLOF.    Baseline IE: unable    Time 8    Period Weeks    Status New    Target Date 10/19/19  Plan - 08/31/19 0952    Clinical Impression Statement Patient presents to clinic with excellent motivation to participate in therapy. Patient demonstrates deficits in posture, R hip ROM, R hip strength, pain, balance, and activity tolerance. Patient able to achieve significant pain reduction in R posterior hip with combined soft tissue and joint mobilization as well as decreased R anterior hip symptoms during today's session and responded positively to active interventions. Patient will benefit from continued skilled therapeutic intervention to address remaining deficits in posture, R hip ROM, R hip strength, pain, balance, and activity tolerance in order to increase function and improve overall QOL.    Personal Factors and Comorbidities Age;Behavior Pattern;Comorbidity 3+;Past/Current Experience;Fitness;Time since onset of injury/illness/exacerbation    Comorbidities DM 2, HLD, HTN, OA, acide reflux    Examination-Activity Limitations Bathing;Dressing;Transfers;Squat;Lift;Bend;Stairs;Stand;Continence    Examination-Participation Restrictions Interpersonal Relationship;Yard Work;Cleaning;Laundry;Driving;Community Activity    Stability/Clinical Decision Making Evolving/Moderate complexity    Rehab Potential Fair    PT Frequency 2x / week    PT Duration 8 weeks    PT  Treatment/Interventions ADLs/Self Care Home Management;Aquatic Therapy;Moist Heat;Cryotherapy;Electrical Stimulation;Therapeutic activities;Neuromuscular re-education;Functional mobility training;Stair training;Gait training;Therapeutic exercise;Balance training;Patient/family education;Manual techniques;Taping;Passive range of motion;Dry needling;Joint Manipulations;Spinal Manipulations    PT Next Visit Plan manual (obturator internus assessment), postural re-education    PT Home Exercise Plan R sciatic nerve glides    Consulted and Agree with Plan of Care Patient           Patient will benefit from skilled therapeutic intervention in order to improve the following deficits and impairments:  Abnormal gait, Decreased balance, Decreased endurance, Decreased mobility, Difficulty walking, Hypomobility, Obesity, Pain, Postural dysfunction, Improper body mechanics, Impaired flexibility, Decreased strength, Decreased coordination, Decreased activity tolerance, Decreased range of motion  Visit Diagnosis: Pain in right hip  Muscle weakness (generalized)  Abnormal posture     Problem List Patient Active Problem List   Diagnosis Date Noted  . S/P lumbar fusion 04/12/2019  . Degeneration of intervertebral disc of lumbar region 10/18/2014  . Lumbar canal stenosis 04/19/2014  . Bursitis, trochanteric 04/19/2014  . Morbid obesity ([place]) 11/04/2013  . Type 2 diabetes mellitus with other diabetic neurological complication ([place]) 35/36/1443  . Generalized OA 07/08/2013  . Acid reflux 07/08/2013  . Benign hypertension 07/08/2013  . HLD (hyperlipidemia) 07/08/2013   Myles Gip PT, DPT [phone] 08/31/2019, 2:56 PM  [facility] [address] [place], [place], [address] Phone: [phone]   Fax:  [phone]  Name: Carly Bell MRN: [id number] Date of Birth: 11/08/1940

## 2019-09-04 ENCOUNTER — Ambulatory Visit: Payer: Medicare HMO | Admitting: Physical Therapy

## 2019-09-04 ENCOUNTER — Encounter: Payer: Self-pay | Admitting: Physical Therapy

## 2019-09-04 ENCOUNTER — Other Ambulatory Visit: Payer: Self-pay

## 2019-09-04 DIAGNOSIS — M6281 Muscle weakness (generalized): Secondary | ICD-10-CM

## 2019-09-04 DIAGNOSIS — M25551 Pain in right hip: Secondary | ICD-10-CM | POA: Diagnosis not present

## 2019-09-04 DIAGNOSIS — R293 Abnormal posture: Secondary | ICD-10-CM

## 2019-09-04 NOTE — Therapy (Signed)
[facility] [address]. [place], [place], [address] Phone: [phone]   Fax:  [phone]  Physical Therapy Treatment  Patient Details  Name: Carly Bell MRN: [id number] Date of Birth: Jul 19, 1940 Referring Provider (PT): Rosalia Hammers   Encounter Date: 09/04/2019   PT End of Session - 09/04/19 1045    Visit Number 4    Number of Visits 16    Date for PT Re-Evaluation 10/19/19    Authorization Type IE 08/24/2019    PT Start Time 1045    PT Stop Time 1140    PT Time Calculation (min) 55 min    Activity Tolerance Patient tolerated treatment well    Behavior During Therapy [facility] for tasks assessed/performed           Past Medical History:  Diagnosis Date  . Arthritis   . Cardiac murmur Diagnosed in 2007  . Diabetes mellitus without complication ([place])   . Diverticulosis   . GERD (gastroesophageal reflux disease)   . Hyperlipidemia   . Hypertension   . Lower extremity edema   . PVD (peripheral vascular disease) ([place])     Past Surgical History:  Procedure Laterality Date  . ANTERIOR LATERAL LUMBAR FUSION WITH PERCUTANEOUS SCREW 1 LEVEL N/A 04/12/2019   Procedure: L4-5 LATERAL LUMBAR INTERBODY FUSION WITH POSTERIOR FIXATION AT L4-5;  Surgeon: Meade Maw, MD;  Location: [facility] ORS;  Service: Neurosurgery;  Laterality: N/A;  . BREAST BIOPSY Left 1990's   neg- Done in Radiology  . CHOLECYSTECTOMY  2008  . COLON SURGERY     bowel obstruction  . COLONOSCOPY W/ POLYPECTOMY  07/2008   Dr. Rochel Brome  . COLONOSCOPY WITH PROPOFOL N/A 12/25/2014   Procedure: COLONOSCOPY WITH PROPOFOL;  Surgeon: Lollie Sails, MD;  Location: [facility] ENDOSCOPY;  Service: Endoscopy;  Laterality: N/A;  . COLONOSCOPY WITH PROPOFOL N/A 10/27/2016   Procedure: COLONOSCOPY WITH PROPOFOL;  Surgeon: Lollie Sails, MD;  Location[facility] ENDOSCOPY;  Service: Endoscopy;  Laterality: N/A;  . CYSTOSCOPY WITH STENT PLACEMENT Right 04/11/2015    Procedure: CYSTOSCOPY WITH STENT PLACEMENT;  Surgeon: Hollice Espy, MD;  Location: [facility] ORS;  Service: Urology;  Laterality: Right;  . DILATION AND CURETTAGE OF UTERUS  Unsure of Date  . EYE SURGERY Bilateral 2012   Cataract Removal- [facility]  . LAPAROSCOPIC RIGHT COLECTOMY Right 04/11/2015   Procedure: LAPAROSCOPIC RIGHT COLECTOMY, REMOVAL OF RIGHT URETERAL STENT;  Surgeon: Hubbard Robinson, MD;  Location: [facility] ORS;  Service: General;  Laterality: Right;  . TONSILLECTOMY  as child    There were no vitals filed for this visit.   Subjective Assessment - 09/04/19 1046    Subjective Patient notes that she is "so-so" today, but reports that she felt good for a couple days after our last session. Patient states that it doesn't hurt like it used to but the pain remains right here (points to R SIJ). Patient notes some increased stress this week due to increased caregiving for a family member. Patient was able to walk to visit her sister in law without difficulty which is a new improvement.    Currently in Pain? Yes    Pain Score 5     Pain Location Back    Pain Orientation Right;Posterior;Lower           TREATMENT  Pre-treatment assessment: increased mm tension on R posterior hip  Manual Therapy: STM and TPR performed to R gluteal complex and sacral ligaments to allow for  decreased tension and pain and improved posture and function using virbatory and percussive device STM and TPR performed to R anterior thigh to allow for decreased tension and pain and improved posture and function R sacral border mobilization to allow for improved mobility and function, grade III R sacral border mobilizations with movement (R clamshell) for decreased spasm and improved mobility, grade III  Neuromuscular Re-education: Sidelying R clamshell for improved mobility of R sacral border Sidelying R hip flexor stretch (PT assisted)/hip extension motor control for improved force closure of R  SIJ Seated heel squeezes for improved force closure of SIJ and symmetry in mm tone Sidelying R ITB stretch for improved postural awareness and decreased R hip pain  Patient educated throughout session on appropriate technique and form using multi-modal cueing, HEP, and activity modification. Patient articulated understanding and returned demonstration.  Patient Response to interventions: 1/10 pain walking out of clinic  ASSESSMENT Patient presents to clinic with excellent motivation to participate in therapy. Patient demonstrates deficits in posture, R hip ROM, R hip strength, pain, balance, and activity tolerance. Patient continues to have significant pain reduction from force closure activities and manual interventions during today's session and responded positively to active interventions. Patient will benefit from continued skilled therapeutic intervention to address remaining deficits in posture, R hip ROM, R hip strength, pain, balance, and activity tolerance in order to increase function and improve overall QOL.     PT Long Term Goals - 08/24/19 1049      PT LONG TERM GOAL #1   Title Patient will demonstrate independence with HEP in order to maximize therapeutic gains and improve carryover from physical therapy sessions to ADLs in the home and community.    Baseline IE: provided    Time 8    Period Weeks    Status New    Target Date 10/19/19      PT LONG TERM GOAL #2   Title Patient will demonstrate improved function as evidenced by a score of 52 on FOTO measure for full participation in activities at home and in the community.    Baseline IE: 31    Time 8    Period Weeks    Status New    Target Date 10/19/19      PT LONG TERM GOAL #3   Title Patient will decrease worst pain as reported on NPRS by at least 2 points to demonstrate clinically significant reduction in pain in order to restore/improve function and overall QOL.    Baseline IE: 9/10    Time 8    Period Weeks     Status New    Target Date 10/19/19      PT LONG TERM GOAL #4   Title Patient will rate standing for an hour and walking a mile as "moderate difficulty/a little bit of difficulty" in order to participate more fully in community activities.    Baseline IE: "quite a bit of difficulty"    Time 8    Period Weeks    Status New    Target Date 10/19/19      PT LONG TERM GOAL #5   Title Patient will be able to sweep floors of home without rest break and with proper body mechanics in order to return to PLOF.    Baseline IE: unable    Time 8    Period Weeks    Status New    Target Date 10/19/19  Plan - 09/04/19 1045    Clinical Impression Statement Patient presents to clinic with excellent motivation to participate in therapy. Patient demonstrates deficits in posture, R hip ROM, R hip strength, pain, balance, and activity tolerance. Patient continues to have significant pain reduction from force closure activities and manual interventions during today's session and responded positively to active interventions. Patient will benefit from continued skilled therapeutic intervention to address remaining deficits in posture, R hip ROM, R hip strength, pain, balance, and activity tolerance in order to increase function and improve overall QOL.    Personal Factors and Comorbidities Age;Behavior Pattern;Comorbidity 3+;Past/Current Experience;Fitness;Time since onset of injury/illness/exacerbation    Comorbidities DM 2, HLD, HTN, OA, acide reflux    Examination-Activity Limitations Bathing;Dressing;Transfers;Squat;Lift;Bend;Stairs;Stand;Continence    Examination-Participation Restrictions Interpersonal Relationship;Yard Work;Cleaning;Laundry;Driving;Community Activity    Stability/Clinical Decision Making Evolving/Moderate complexity    Rehab Potential Fair    PT Frequency 2x / week    PT Duration 8 weeks    PT Treatment/Interventions ADLs/Self Care Home Management;Aquatic  Therapy;Moist Heat;Cryotherapy;Electrical Stimulation;Therapeutic activities;Neuromuscular re-education;Functional mobility training;Stair training;Gait training;Therapeutic exercise;Balance training;Patient/family education;Manual techniques;Taping;Passive range of motion;Dry needling;Joint Manipulations;Spinal Manipulations    PT Next Visit Plan manual (obturator internus assessment), postural re-education    PT Home Exercise Plan R sciatic nerve glides    Consulted and Agree with Plan of Care Patient           Patient will benefit from skilled therapeutic intervention in order to improve the following deficits and impairments:  Abnormal gait, Decreased balance, Decreased endurance, Decreased mobility, Difficulty walking, Hypomobility, Obesity, Pain, Postural dysfunction, Improper body mechanics, Impaired flexibility, Decreased strength, Decreased coordination, Decreased activity tolerance, Decreased range of motion  Visit Diagnosis: Pain in right hip  Muscle weakness (generalized)  Abnormal posture     Problem List Patient Active Problem List   Diagnosis Date Noted  . S/P lumbar fusion 04/12/2019  . Degeneration of intervertebral disc of lumbar region 10/18/2014  . Lumbar canal stenosis 04/19/2014  . Bursitis, trochanteric 04/19/2014  . Morbid obesity ([place]) 11/04/2013  . Type 2 diabetes mellitus with other diabetic neurological complication ([place]) 40/97/3532  . Generalized OA 07/08/2013  . Acid reflux 07/08/2013  . Benign hypertension 07/08/2013  . HLD (hyperlipidemia) 07/08/2013   Myles Gip PT, DPT [phone] 09/04/2019, 11:44 AM  [place] [facility] [address] [place], [place], [address] Phone: [phone]   Fax:  [phone]  Name: Carly Bell MRN: [id number] Date of Birth: 21-Nov-1940

## 2019-09-06 ENCOUNTER — Other Ambulatory Visit: Payer: Self-pay

## 2019-09-06 ENCOUNTER — Encounter: Payer: Self-pay | Admitting: Physical Therapy

## 2019-09-06 ENCOUNTER — Ambulatory Visit: Payer: Medicare HMO | Admitting: Physical Therapy

## 2019-09-06 DIAGNOSIS — R293 Abnormal posture: Secondary | ICD-10-CM

## 2019-09-06 DIAGNOSIS — M25551 Pain in right hip: Secondary | ICD-10-CM

## 2019-09-06 DIAGNOSIS — M6281 Muscle weakness (generalized): Secondary | ICD-10-CM

## 2019-09-06 NOTE — Therapy (Signed)
[facility] [address] [place], [place], [address] Phone: [phone]   Fax:  [phone]  Physical Therapy Treatment  Patient Details  Name: Carly Bell MRN: [id number] Date of Birth: 10-24-1940 Referring Provider (PT): Rosalia Hammers   Encounter Date: 09/06/2019   PT End of Session - 09/06/19 1113    Visit Number 5    Number of Visits 16    Date for PT Re-Evaluation 10/19/19    Authorization Type IE 08/24/2019    PT Start Time 1100    PT Stop Time 1155    PT Time Calculation (min) 55 min    Activity Tolerance Patient tolerated treatment well    Behavior During Therapy [facility] for tasks assessed/performed           Past Medical History:  Diagnosis Date  . Arthritis   . Cardiac murmur Diagnosed in 2007  . Diabetes mellitus without complication ([place])   . Diverticulosis   . GERD (gastroesophageal reflux disease)   . Hyperlipidemia   . Hypertension   . Lower extremity edema   . PVD (peripheral vascular disease) ([place])     Past Surgical History:  Procedure Laterality Date  . ANTERIOR LATERAL LUMBAR FUSION WITH PERCUTANEOUS SCREW 1 LEVEL N/A 04/12/2019   Procedure: L4-5 LATERAL LUMBAR INTERBODY FUSION WITH POSTERIOR FIXATION AT L4-5;  Surgeon: Meade Maw, MD;  Location: [facility] ORS;  Service: Neurosurgery;  Laterality: N/A;  . BREAST BIOPSY Left 1990's   neg- Done in Radiology  . CHOLECYSTECTOMY  2008  . COLON SURGERY     bowel obstruction  . COLONOSCOPY W/ POLYPECTOMY  07/2008   Dr. Rochel Brome  . COLONOSCOPY WITH PROPOFOL N/A 12/25/2014   Procedure: COLONOSCOPY WITH PROPOFOL;  Surgeon: Lollie Sails, MD;  Location[facility] ENDOSCOPY;  Service: Endoscopy;  Laterality: N/A;  . COLONOSCOPY WITH PROPOFOL N/A 10/27/2016   Procedure: COLONOSCOPY WITH PROPOFOL;  Surgeon: Lollie Sails, MD;  Location: [facility] ENDOSCOPY;  Service: Endoscopy;  Laterality: N/A;  . CYSTOSCOPY WITH STENT PLACEMENT Right 04/11/2015    Procedure: CYSTOSCOPY WITH STENT PLACEMENT;  Surgeon: Hollice Espy, MD;  Location: [facility] ORS;  Service: Urology;  Laterality: Right;  . DILATION AND CURETTAGE OF UTERUS  Unsure of Date  . EYE SURGERY Bilateral 2012   Cataract Removal- [facility]  . LAPAROSCOPIC RIGHT COLECTOMY Right 04/11/2015   Procedure: LAPAROSCOPIC RIGHT COLECTOMY, REMOVAL OF RIGHT URETERAL STENT;  Surgeon: Hubbard Robinson, MD;  Location: [facility] ORS;  Service: General;  Laterality: Right;  . TONSILLECTOMY  as child    There were no vitals filed for this visit.   Subjective Assessment - 09/06/19 1110    Subjective Patient notes that after last session when she got home she had some increased R hip pain > in the posterior portion with some irritation in the front. Patient adds that its calmed down some and she cannot think of anything that she did activity wise that would have irritated it.    Currently in Pain? Yes    Pain Score 6     Pain Location Hip    Pain Orientation Right;Posterior;Lower    Pain Descriptors / Indicators Aching           TREATMENT  Pre-treatment assessment: increased mm tension on R posterior hip  Manual Therapy: STM and TPR performed to R gluteal complex and sacral ligaments to allow for decreased tension and pain and improved posture and function using virbatory and percussive device STM  and TPR performed to R anterior thigh to allow for decreased tension and pain and improved posture and function using virbatory and percussive device R sacral border mobilization to allow for improved mobility and function, grade III  Neuromuscular Re-education: Seated anteior-posterior pelvic tilts for improved postural awareness Seated heel squeezes for improved force closure of SIJ and symmetry in mm tone Standing LLE step taps for improved RLE motor control  Patient educated throughout session on appropriate technique and form using multi-modal cueing, HEP, and activity  modification. Patient articulated understanding and returned demonstration.  Patient Response to interventions: 1/10 pinch at anterior/lateral R hip walking out of clinic  ASSESSMENT Patient presents to clinic with excellent motivation to participate in therapy. Patient demonstrates deficits in posture, R hip ROM, R hip strength, pain, balance, and activity tolerance. Patient had significant pain reduction from manual activities during today's session and responded positively to active interventions. Patient will benefit from continued skilled therapeutic intervention to address remaining deficits in posture, R hip ROM, R hip strength, pain, balance, and activity tolerance in order to increase function and improve overall QOL.      PT Long Term Goals - 08/24/19 1049      PT LONG TERM GOAL #1   Title Patient will demonstrate independence with HEP in order to maximize therapeutic gains and improve carryover from physical therapy sessions to ADLs in the home and community.    Baseline IE: provided    Time 8    Period Weeks    Status New    Target Date 10/19/19      PT LONG TERM GOAL #2   Title Patient will demonstrate improved function as evidenced by a score of 52 on FOTO measure for full participation in activities at home and in the community.    Baseline IE: 31    Time 8    Period Weeks    Status New    Target Date 10/19/19      PT LONG TERM GOAL #3   Title Patient will decrease worst pain as reported on NPRS by at least 2 points to demonstrate clinically significant reduction in pain in order to restore/improve function and overall QOL.    Baseline IE: 9/10    Time 8    Period Weeks    Status New    Target Date 10/19/19      PT LONG TERM GOAL #4   Title Patient will rate standing for an hour and walking a mile as "moderate difficulty/a little bit of difficulty" in order to participate more fully in community activities.    Baseline IE: "quite a bit of difficulty"    Time 8     Period Weeks    Status New    Target Date 10/19/19      PT LONG TERM GOAL #5   Title Patient will be able to sweep floors of home without rest break and with proper body mechanics in order to return to PLOF.    Baseline IE: unable    Time 8    Period Weeks    Status New    Target Date 10/19/19                 Plan - 09/06/19 1114    Clinical Impression Statement Patient presents to clinic with excellent motivation to participate in therapy. Patient demonstrates deficits in posture, R hip ROM, R hip strength, pain, balance, and activity tolerance. Patient had significant pain reduction from manual activities during  today's session and responded positively to active interventions. Patient will benefit from continued skilled therapeutic intervention to address remaining deficits in posture, R hip ROM, R hip strength, pain, balance, and activity tolerance in order to increase function and improve overall QOL.    Personal Factors and Comorbidities Age;Behavior Pattern;Comorbidity 3+;Past/Current Experience;Fitness;Time since onset of injury/illness/exacerbation    Comorbidities DM 2, HLD, HTN, OA, acide reflux    Examination-Activity Limitations Bathing;Dressing;Transfers;Squat;Lift;Bend;Stairs;Stand;Continence    Examination-Participation Restrictions Interpersonal Relationship;Yard Work;Cleaning;Laundry;Driving;Community Activity    Stability/Clinical Decision Making Evolving/Moderate complexity    Rehab Potential Fair    PT Frequency 2x / week    PT Duration 8 weeks    PT Treatment/Interventions ADLs/Self Care Home Management;Aquatic Therapy;Moist Heat;Cryotherapy;Electrical Stimulation;Therapeutic activities;Neuromuscular re-education;Functional mobility training;Stair training;Gait training;Therapeutic exercise;Balance training;Patient/family education;Manual techniques;Taping;Passive range of motion;Dry needling;Joint Manipulations;Spinal Manipulations    PT Next Visit Plan manual  (obturator internus assessment), postural re-education    PT Home Exercise Plan R sciatic nerve glides    Consulted and Agree with Plan of Care Patient           Patient will benefit from skilled therapeutic intervention in order to improve the following deficits and impairments:  Abnormal gait, Decreased balance, Decreased endurance, Decreased mobility, Difficulty walking, Hypomobility, Obesity, Pain, Postural dysfunction, Improper body mechanics, Impaired flexibility, Decreased strength, Decreased coordination, Decreased activity tolerance, Decreased range of motion  Visit Diagnosis: Pain in right hip  Muscle weakness (generalized)  Abnormal posture     Problem List Patient Active Problem List   Diagnosis Date Noted  . S/P lumbar fusion 04/12/2019  . Degeneration of intervertebral disc of lumbar region 10/18/2014  . Lumbar canal stenosis 04/19/2014  . Bursitis, trochanteric 04/19/2014  . Morbid obesity ([place]) 11/04/2013  . Type 2 diabetes mellitus with other diabetic neurological complication ([place]) 67/59/1638  . Generalized OA 07/08/2013  . Acid reflux 07/08/2013  . Benign hypertension 07/08/2013  . HLD (hyperlipidemia) 07/08/2013   Myles Gip PT, DPT [phone] 09/06/2019, 12:42 PM  [facility] [address] [place], [place], [address] Phone: [phone]   Fax:  [phone]  Name: Carly Bell MRN: [id number] Date of Birth: Apr 04, 1940

## 2019-09-14 ENCOUNTER — Other Ambulatory Visit: Payer: Self-pay

## 2019-09-14 ENCOUNTER — Ambulatory Visit: Payer: Medicare HMO | Admitting: Physical Therapy

## 2019-09-14 ENCOUNTER — Encounter: Payer: Self-pay | Admitting: Physical Therapy

## 2019-09-14 DIAGNOSIS — M25551 Pain in right hip: Secondary | ICD-10-CM | POA: Diagnosis not present

## 2019-09-14 DIAGNOSIS — M6281 Muscle weakness (generalized): Secondary | ICD-10-CM

## 2019-09-14 DIAGNOSIS — R293 Abnormal posture: Secondary | ICD-10-CM

## 2019-09-14 NOTE — Therapy (Signed)
[facility] [address] [place], [place], [address] Phone: [phone]   Fax:  [phone]  Physical Therapy Treatment  Patient Details  Name: Carly Bell MRN: [id number] Date of Birth: February 12, 1940 Referring Provider (PT): Rosalia Hammers   Encounter Date: 09/14/2019   PT End of Session - 09/14/19 1010    Visit Number 6    Number of Visits 16    Date for PT Re-Evaluation 10/19/19    Authorization Type IE 08/24/2019    PT Start Time 1000    PT Stop Time 1055    PT Time Calculation (min) 55 min    Activity Tolerance Patient tolerated treatment well    Behavior During Therapy [facility] for tasks assessed/performed           Past Medical History:  Diagnosis Date  . Arthritis   . Cardiac murmur Diagnosed in 2007  . Diabetes mellitus without complication ([place])   . Diverticulosis   . GERD (gastroesophageal reflux disease)   . Hyperlipidemia   . Hypertension   . Lower extremity edema   . PVD (peripheral vascular disease) ([place])     Past Surgical History:  Procedure Laterality Date  . ANTERIOR LATERAL LUMBAR FUSION WITH PERCUTANEOUS SCREW 1 LEVEL N/A 04/12/2019   Procedure: L4-5 LATERAL LUMBAR INTERBODY FUSION WITH POSTERIOR FIXATION AT L4-5;  Surgeon: Meade Maw, MD;  Location: [facility] ORS;  Service: Neurosurgery;  Laterality: N/A;  . BREAST BIOPSY Left 1990's   neg- Done in Radiology  . CHOLECYSTECTOMY  2008  . COLON SURGERY     bowel obstruction  . COLONOSCOPY W/ POLYPECTOMY  07/2008   Dr. Rochel Brome  . COLONOSCOPY WITH PROPOFOL N/A 12/25/2014   Procedure: COLONOSCOPY WITH PROPOFOL;  Surgeon: Lollie Sails, MD;  Location[facility] ENDOSCOPY;  Service: Endoscopy;  Laterality: N/A;  . COLONOSCOPY WITH PROPOFOL N/A 10/27/2016   Procedure: COLONOSCOPY WITH PROPOFOL;  Surgeon: Lollie Sails, MD;  Location: [facility] ENDOSCOPY;  Service: Endoscopy;  Laterality: N/A;  . CYSTOSCOPY WITH STENT PLACEMENT Right 04/11/2015    Procedure: CYSTOSCOPY WITH STENT PLACEMENT;  Surgeon: Hollice Espy, MD;  Location: [facility] ORS;  Service: Urology;  Laterality: Right;  . DILATION AND CURETTAGE OF UTERUS  Unsure of Date  . EYE SURGERY Bilateral 2012   Cataract Removal- [facility]  . LAPAROSCOPIC RIGHT COLECTOMY Right 04/11/2015   Procedure: LAPAROSCOPIC RIGHT COLECTOMY, REMOVAL OF RIGHT URETERAL STENT;  Surgeon: Hubbard Robinson, MD;  Location: [facility] ORS;  Service: General;  Laterality: Right;  . TONSILLECTOMY  as child    There were no vitals filed for this visit.   Subjective Assessment - 09/14/19 1006    Subjective Patient reports that the back of the R hip continues to be painful. Patient notes the front of the R hip is less aggravated, but she did have some GI distress which exacerbated her symptoms. Patient notes that has resolved since. Patient has been home all week and doing no more than she has to. Patient did watch her great-grandchild yesterday without much irritation.    Currently in Pain? Yes    Pain Score 6     Pain Location Hip    Pain Orientation Right;Posterior;Lower    Pain Descriptors / Indicators Aching          TREATMENT  Manual Therapy: STM and TPR performed to R gluteal complex and sacral ligaments to allow for decreased tension and pain and improved posture and function using virbatory and percussive  device STM and TPR performed to R anterior thigh to allow for decreased tension and pain and improved posture and function using virbatory and percussive device R sacral border mobilization to allow for improved mobility and function, grade III  Neuromuscular Re-education: Sidelying hip internal rotation/reverse clams for improved hip motor control Seated hip internal rotation/reverse clams for improved hip motor control Patient education on adding movement "snacks" into her day to increase tolerance to activity. Patient agrees to walk one living room/kitchen lap per commercial  break when watching television.  Patient educated throughout session on appropriate technique and form using multi-modal cueing, HEP, and activity modification. Patient articulated understanding and returned demonstration.  Patient Response to interventions: Confident with new HEP activities.  ASSESSMENT Patient presents to clinic with excellent motivation to participate in therapy. Patient demonstrates deficits in posture, R hip ROM, R hip strength, pain, balance, and activity tolerance. Patient tolerating hip IR well with some symptom resolve during today's session and responded positively to active interventions. Patient will benefit from continued skilled therapeutic intervention to address remaining deficits in posture, R hip ROM, R hip strength, pain, balance, and activity tolerance in order to increase function and improve overall QOL.     PT Long Term Goals - 08/24/19 1049      PT LONG TERM GOAL #1   Title Patient will demonstrate independence with HEP in order to maximize therapeutic gains and improve carryover from physical therapy sessions to ADLs in the home and community.    Baseline IE: provided    Time 8    Period Weeks    Status New    Target Date 10/19/19      PT LONG TERM GOAL #2   Title Patient will demonstrate improved function as evidenced by a score of 52 on FOTO measure for full participation in activities at home and in the community.    Baseline IE: 31    Time 8    Period Weeks    Status New    Target Date 10/19/19      PT LONG TERM GOAL #3   Title Patient will decrease worst pain as reported on NPRS by at least 2 points to demonstrate clinically significant reduction in pain in order to restore/improve function and overall QOL.    Baseline IE: 9/10    Time 8    Period Weeks    Status New    Target Date 10/19/19      PT LONG TERM GOAL #4   Title Patient will rate standing for an hour and walking a mile as "moderate difficulty/a little bit of difficulty"  in order to participate more fully in community activities.    Baseline IE: "quite a bit of difficulty"    Time 8    Period Weeks    Status New    Target Date 10/19/19      PT LONG TERM GOAL #5   Title Patient will be able to sweep floors of home without rest break and with proper body mechanics in order to return to PLOF.    Baseline IE: unable    Time 8    Period Weeks    Status New    Target Date 10/19/19                 Plan - 09/14/19 1010    Clinical Impression Statement Patient presents to clinic with excellent motivation to participate in therapy. Patient demonstrates deficits in posture, R hip ROM, R hip strength,  pain, balance, and activity tolerance. Patient tolerating hip IR well with some symptom resolve during today's session and responded positively to active interventions. Patient will benefit from continued skilled therapeutic intervention to address remaining deficits in posture, R hip ROM, R hip strength, pain, balance, and activity tolerance in order to increase function and improve overall QOL.    Personal Factors and Comorbidities Age;Behavior Pattern;Comorbidity 3+;Past/Current Experience;Fitness;Time since onset of injury/illness/exacerbation    Comorbidities DM 2, HLD, HTN, OA, acide reflux    Examination-Activity Limitations Bathing;Dressing;Transfers;Squat;Lift;Bend;Stairs;Stand;Continence    Examination-Participation Restrictions Interpersonal Relationship;Yard Work;Cleaning;Laundry;Driving;Community Activity    Stability/Clinical Decision Making Evolving/Moderate complexity    Rehab Potential Fair    PT Frequency 2x / week    PT Duration 8 weeks    PT Treatment/Interventions ADLs/Self Care Home Management;Aquatic Therapy;Moist Heat;Cryotherapy;Electrical Stimulation;Therapeutic activities;Neuromuscular re-education;Functional mobility training;Stair training;Gait training;Therapeutic exercise;Balance training;Patient/family education;Manual  techniques;Taping;Passive range of motion;Dry needling;Joint Manipulations;Spinal Manipulations    PT Next Visit Plan manual (obturator internus assessment), postural re-education    PT Home Exercise Plan R sciatic nerve glides    Consulted and Agree with Plan of Care Patient           Patient will benefit from skilled therapeutic intervention in order to improve the following deficits and impairments:  Abnormal gait, Decreased balance, Decreased endurance, Decreased mobility, Difficulty walking, Hypomobility, Obesity, Pain, Postural dysfunction, Improper body mechanics, Impaired flexibility, Decreased strength, Decreased coordination, Decreased activity tolerance, Decreased range of motion  Visit Diagnosis: Pain in right hip  Muscle weakness (generalized)  Abnormal posture     Problem List Patient Active Problem List   Diagnosis Date Noted  . S/P lumbar fusion 04/12/2019  . Degeneration of intervertebral disc of lumbar region 10/18/2014  . Lumbar canal stenosis 04/19/2014  . Bursitis, trochanteric 04/19/2014  . Morbid obesity ([place]) 11/04/2013  . Type 2 diabetes mellitus with other diabetic neurological complication ([place]) 18/29/9371  . Generalized OA 07/08/2013  . Acid reflux 07/08/2013  . Benign hypertension 07/08/2013  . HLD (hyperlipidemia) 07/08/2013   Myles Gip PT, DPT [phone]  09/14/2019, 12:17 PM  [facility] [address] [place], [place], [address] Phone: [phone]   Fax:  [phone]  Name: BREIANNA DELFINO MRN: [id number] Date of Birth: 1940-10-01

## 2019-09-19 ENCOUNTER — Ambulatory Visit: Payer: Medicare HMO | Admitting: Physical Therapy

## 2019-09-19 ENCOUNTER — Encounter: Payer: Self-pay | Admitting: Physical Therapy

## 2019-09-19 ENCOUNTER — Other Ambulatory Visit: Payer: Self-pay

## 2019-09-19 DIAGNOSIS — M25551 Pain in right hip: Secondary | ICD-10-CM

## 2019-09-19 DIAGNOSIS — M6281 Muscle weakness (generalized): Secondary | ICD-10-CM

## 2019-09-19 DIAGNOSIS — R293 Abnormal posture: Secondary | ICD-10-CM

## 2019-09-19 NOTE — Therapy (Signed)
[facility] [address] [place], [place], [address] Phone: [phone]   Fax:  [phone]  Physical Therapy Treatment  Patient Details  Name: Carly Bell MRN: [id number] Date of Birth: [birth year] Referring Provider (PT): Rosalia Hammers   Encounter Date: 09/19/2019   PT End of Session - 09/19/19 0906    Visit Number 7    Number of Visits 16    Date for PT Re-Evaluation 10/19/19    Authorization Type IE 08/24/2019    PT Start Time 0855    PT Stop Time 0950    PT Time Calculation (min) 55 min    Activity Tolerance Patient tolerated treatment well    Behavior During Therapy [facility] for tasks assessed/performed           Past Medical History:  Diagnosis Date  . Arthritis   . Cardiac murmur Diagnosed in 2007  . Diabetes mellitus without complication ([place])   . Diverticulosis   . GERD (gastroesophageal reflux disease)   . Hyperlipidemia   . Hypertension   . Lower extremity edema   . PVD (peripheral vascular disease) ([place])     Past Surgical History:  Procedure Laterality Date  . ANTERIOR LATERAL LUMBAR FUSION WITH PERCUTANEOUS SCREW 1 LEVEL N/A 04/12/2019   Procedure: L4-5 LATERAL LUMBAR INTERBODY FUSION WITH POSTERIOR FIXATION AT L4-5;  Surgeon: Meade Maw, MD;  Location: [facility] ORS;  Service: Neurosurgery;  Laterality: N/A;  . BREAST BIOPSY Left 1990's   neg- Done in Radiology  . CHOLECYSTECTOMY  2008  . COLON SURGERY     bowel obstruction  . COLONOSCOPY W/ POLYPECTOMY  07/2008   Dr. Rochel Brome  . COLONOSCOPY WITH PROPOFOL N/A 12/25/2014   Procedure: COLONOSCOPY WITH PROPOFOL;  Surgeon: Lollie Sails, MD;  Location: [facility] ENDOSCOPY;  Service: Endoscopy;  Laterality: N/A;  . COLONOSCOPY WITH PROPOFOL N/A 10/27/2016   Procedure: COLONOSCOPY WITH PROPOFOL;  Surgeon: Lollie Sails, MD;  Location: [facility] ENDOSCOPY;  Service: Endoscopy;  Laterality: N/A;  . CYSTOSCOPY WITH STENT PLACEMENT Right 04/11/2015    Procedure: CYSTOSCOPY WITH STENT PLACEMENT;  Surgeon: Hollice Espy, MD;  Location: [facility] ORS;  Service: Urology;  Laterality: Right;  . DILATION AND CURETTAGE OF UTERUS  Unsure of Date  . EYE SURGERY Bilateral 2012   Cataract Removal- [facility]  . LAPAROSCOPIC RIGHT COLECTOMY Right 04/11/2015   Procedure: LAPAROSCOPIC RIGHT COLECTOMY, REMOVAL OF RIGHT URETERAL STENT;  Surgeon: Hubbard Robinson, MD;  Location: [facility] ORS;  Service: General;  Laterality: Right;  . TONSILLECTOMY  as child    There were no vitals filed for this visit.   Subjective Assessment - 09/19/19 0900    Subjective Patient reports that she feels things are improving. She had a good weekend and was able to walk the whole distance through the ALF to see her sister in law. Patient adds that she hasn't had a ton of pain at the posterior hip or groin, but is most bothered by burning and numbness in the R anterior thigh.    Currently in Pain? Yes    Pain Score 4     Pain Location Back    Pain Descriptors / Indicators Aching;Burning;Numbness    Pain Radiating Towards R thigh           TREATMENT  Manual Therapy: STM and TPR performed to R anterior and lateral thigh to allow for decreased tension and pain and improved posture and function using virbatory and percussive device R sacral  border mobilization to allow for improved mobility and function, grade III  Neuromuscular Re-education: Sidelying femoral nerve glides for decreased discomfort and improved R hip mobility Seated marches for improved RLE hip flexion motor control Seated TrA activation with TCs and coordinated breath for improved postural control/endurance  Patient educated throughout session on appropriate technique and form using multi-modal cueing, HEP, and activity modification. Patient articulated understanding and returned demonstration.  Patient Response to interventions: Patient denies pain at end of session.   ASSESSMENT Patient  presents to clinic with excellent motivation to participate in therapy. Patient demonstrates deficits in posture, R hip ROM, R hip strength, pain, balance, and activity tolerance. Patient had significant reduction in pain and was able to coordinate deep core strengthening during today's session and responded positively to active interventions. Patient will benefit from continued skilled therapeutic intervention to address remaining deficits in posture, R hip ROM, R hip strength, pain, balance, and activity tolerance in order to increase function and improve overall QOL.     PT Long Term Goals - 08/24/19 1049      PT LONG TERM GOAL #1   Title Patient will demonstrate independence with HEP in order to maximize therapeutic gains and improve carryover from physical therapy sessions to ADLs in the home and community.    Baseline IE: provided    Time 8    Period Weeks    Status New    Target Date 10/19/19      PT LONG TERM GOAL #2   Title Patient will demonstrate improved function as evidenced by a score of 52 on FOTO measure for full participation in activities at home and in the community.    Baseline IE: 31    Time 8    Period Weeks    Status New    Target Date 10/19/19      PT LONG TERM GOAL #3   Title Patient will decrease worst pain as reported on NPRS by at least 2 points to demonstrate clinically significant reduction in pain in order to restore/improve function and overall QOL.    Baseline IE: 9/10    Time 8    Period Weeks    Status New    Target Date 10/19/19      PT LONG TERM GOAL #4   Title Patient will rate standing for an hour and walking a mile as "moderate difficulty/a little bit of difficulty" in order to participate more fully in community activities.    Baseline IE: "quite a bit of difficulty"    Time 8    Period Weeks    Status New    Target Date 10/19/19      PT LONG TERM GOAL #5   Title Patient will be able to sweep floors of home without rest break and with  proper body mechanics in order to return to PLOF.    Baseline IE: unable    Time 8    Period Weeks    Status New    Target Date 10/19/19                 Plan - 09/19/19 0906    Clinical Impression Statement Patient presents to clinic with excellent motivation to participate in therapy. Patient demonstrates deficits in posture, R hip ROM, R hip strength, pain, balance, and activity tolerance. Patient had significant reduction in pain and was able to coordinate deep core strengthening during today's session and responded positively to active interventions. Patient will benefit from continued skilled therapeutic  intervention to address remaining deficits in posture, R hip ROM, R hip strength, pain, balance, and activity tolerance in order to increase function and improve overall QOL.    Personal Factors and Comorbidities Age;Behavior Pattern;Comorbidity 3+;Past/Current Experience;Fitness;Time since onset of injury/illness/exacerbation    Comorbidities DM 2, HLD, HTN, OA, acide reflux    Examination-Activity Limitations Bathing;Dressing;Transfers;Squat;Lift;Bend;Stairs;Stand;Continence    Examination-Participation Restrictions Interpersonal Relationship;Yard Work;Cleaning;Laundry;Driving;Community Activity    Stability/Clinical Decision Making Evolving/Moderate complexity    Rehab Potential Fair    PT Frequency 2x / week    PT Duration 8 weeks    PT Treatment/Interventions ADLs/Self Care Home Management;Aquatic Therapy;Moist Heat;Cryotherapy;Electrical Stimulation;Therapeutic activities;Neuromuscular re-education;Functional mobility training;Stair training;Gait training;Therapeutic exercise;Balance training;Patient/family education;Manual techniques;Taping;Passive range of motion;Dry needling;Joint Manipulations;Spinal Manipulations    PT Next Visit Plan manual (obturator internus assessment), postural re-education    PT Home Exercise Plan R sciatic nerve glides    Consulted and Agree with  Plan of Care Patient           Patient will benefit from skilled therapeutic intervention in order to improve the following deficits and impairments:  Abnormal gait, Decreased balance, Decreased endurance, Decreased mobility, Difficulty walking, Hypomobility, Obesity, Pain, Postural dysfunction, Improper body mechanics, Impaired flexibility, Decreased strength, Decreased coordination, Decreased activity tolerance, Decreased range of motion  Visit Diagnosis: Pain in right hip  Muscle weakness (generalized)  Abnormal posture     Problem List Patient Active Problem List   Diagnosis Date Noted  . S/P lumbar fusion 04/12/2019  . Degeneration of intervertebral disc of lumbar region 10/18/2014  . Lumbar canal stenosis 04/19/2014  . Bursitis, trochanteric 04/19/2014  . Morbid obesity ([place]) 11/04/2013  . Type 2 diabetes mellitus with other diabetic neurological complication ([place]) 58/83/2549  . Generalized OA 07/08/2013  . Acid reflux 07/08/2013  . Benign hypertension 07/08/2013  . HLD (hyperlipidemia) 07/08/2013   Myles Gip PT, DPT [phone]  09/19/2019, 12:39 PM  [facility] [address] [place], [place], [address] Phone: [phone]   Fax:  [phone]  Name: Carly Bell MRN: [id number] Date of Birth: [birth year]

## 2019-09-20 ENCOUNTER — Ambulatory Visit: Payer: Medicare HMO | Admitting: Physical Therapy

## 2019-09-21 ENCOUNTER — Other Ambulatory Visit: Payer: Self-pay

## 2019-09-21 ENCOUNTER — Ambulatory Visit: Payer: Medicare HMO | Admitting: Physical Therapy

## 2019-09-21 DIAGNOSIS — M25551 Pain in right hip: Secondary | ICD-10-CM

## 2019-09-21 DIAGNOSIS — M6281 Muscle weakness (generalized): Secondary | ICD-10-CM

## 2019-09-21 DIAGNOSIS — R293 Abnormal posture: Secondary | ICD-10-CM

## 2019-09-21 NOTE — Therapy (Signed)
[facility] [address] [place], [place], [address] Phone: [phone]   Fax:  [phone]  Physical Therapy Treatment  Patient Details  Name: Carly Bell MRN: [id number] Date of Birth: 1940-08-29 Referring Provider (PT): Rosalia Hammers   Encounter Date: 09/21/2019   PT End of Session - 09/21/19 1004    Visit Number 8    Number of Visits 16    Date for PT Re-Evaluation 10/19/19    Authorization Type IE 08/24/2019    PT Start Time 0955    PT Stop Time 1050    PT Time Calculation (min) 55 min    Activity Tolerance Patient tolerated treatment well    Behavior During Therapy [facility] for tasks assessed/performed           Past Medical History:  Diagnosis Date  . Arthritis   . Cardiac murmur Diagnosed in 2007  . Diabetes mellitus without complication ([place])   . Diverticulosis   . GERD (gastroesophageal reflux disease)   . Hyperlipidemia   . Hypertension   . Lower extremity edema   . PVD (peripheral vascular disease) ([place])     Past Surgical History:  Procedure Laterality Date  . ANTERIOR LATERAL LUMBAR FUSION WITH PERCUTANEOUS SCREW 1 LEVEL N/A 04/12/2019   Procedure: L4-5 LATERAL LUMBAR INTERBODY FUSION WITH POSTERIOR FIXATION AT L4-5;  Surgeon: Meade Maw, MD;  Location: [facility] ORS;  Service: Neurosurgery;  Laterality: N/A;  . BREAST BIOPSY Left 1990's   neg- Done in Radiology  . CHOLECYSTECTOMY  2008  . COLON SURGERY     bowel obstruction  . COLONOSCOPY W/ POLYPECTOMY  07/2008   Dr. Rochel Brome  . COLONOSCOPY WITH PROPOFOL N/A 12/25/2014   Procedure: COLONOSCOPY WITH PROPOFOL;  Surgeon: Lollie Sails, MD;  [facility]: [facility] ENDOSCOPY;  Service: Endoscopy;  Laterality: N/A;  . COLONOSCOPY WITH PROPOFOL N/A 10/27/2016   Procedure: COLONOSCOPY WITH PROPOFOL;  Surgeon: Lollie Sails, MD;  Location: [facility] ENDOSCOPY;  Service: Endoscopy;  Laterality: N/A;  . CYSTOSCOPY WITH STENT PLACEMENT Right 04/11/2015    Procedure: CYSTOSCOPY WITH STENT PLACEMENT;  Surgeon: Hollice Espy, MD;  Location: [facility] ORS;  Service: Urology;  Laterality: Right;  . DILATION AND CURETTAGE OF UTERUS  Unsure of Date  . EYE SURGERY Bilateral 2012   Cataract Removal- [facility]  . LAPAROSCOPIC RIGHT COLECTOMY Right 04/11/2015   Procedure: LAPAROSCOPIC RIGHT COLECTOMY, REMOVAL OF RIGHT URETERAL STENT;  Surgeon: Hubbard Robinson, MD;  Location: [facility] ORS;  Service: General;  Laterality: Right;  . TONSILLECTOMY  as child    There were no vitals filed for this visit.   Subjective Assessment - 09/21/19 0958    Subjective Patient notes that she continues to be aggrvated by her anterior and lateral thigh pain. Patient adds that her groin pain has resolved almost completely. She is unsure if the antherior and lateral thigh pain as well as the posterior hip/low back pain on R are related to her recent back surgery. DPT encouraged patient to follow up with neurosurgeon.    Currently in Pain? Yes    Pain Score 5     Pain Location Back    Pain Orientation Lower;Right    Pain Descriptors / Indicators Burning;Aching;Numbness    Pain Radiating Towards R thigh           TREATMENT  Manual Therapy: STM and TPR performed to R gluteal complex, R anterior and lateral thigh to allow for decreased tension and pain and improved  posture and function using virbatory and percussive device R sacral border mobilization to allow for improved mobility and function, grade III  Neuromuscular Re-education: Sidelying femoral nerve glides for decreased discomfort and improved R hip mobility Standing TrA activation with TCs and coordinated breath for improved postural control/endurance Standing R hip extension for decreased femoral nerve compression and pain modulation  Patient educated throughout session on appropriate technique and form using multi-modal cueing, HEP, and activity modification. Patient articulated understanding and  returned demonstration.  Patient Response to interventions: Patient denies pain at end of session.   ASSESSMENT Patient presents to clinic with excellent motivation to participate in therapy. Patient demonstrates deficits in posture, R hip ROM, R hip strength, pain, balance, and activity tolerance. Patient continues to have sognificant pain reduction in session and responds well to gentle movement without exacerbation of numbness/burning in R anterior thigh; however carryover from session to session is not significant. Patient will benefit from continued skilled therapeutic intervention to address remaining deficits in posture, R hip ROM, R hip strength, pain, balance, and activity tolerance in order to increase function and improve overall QOL.     PT Long Term Goals - 08/24/19 1049      PT LONG TERM GOAL #1   Title Patient will demonstrate independence with HEP in order to maximize therapeutic gains and improve carryover from physical therapy sessions to ADLs in the home and community.    Baseline IE: provided    Time 8    Period Weeks    Status New    Target Date 10/19/19      PT LONG TERM GOAL #2   Title Patient will demonstrate improved function as evidenced by a score of 52 on FOTO measure for full participation in activities at home and in the community.    Baseline IE: 31    Time 8    Period Weeks    Status New    Target Date 10/19/19      PT LONG TERM GOAL #3   Title Patient will decrease worst pain as reported on NPRS by at least 2 points to demonstrate clinically significant reduction in pain in order to restore/improve function and overall QOL.    Baseline IE: 9/10    Time 8    Period Weeks    Status New    Target Date 10/19/19      PT LONG TERM GOAL #4   Title Patient will rate standing for an hour and walking a mile as "moderate difficulty/a little bit of difficulty" in order to participate more fully in community activities.    Baseline IE: "quite a bit of  difficulty"    Time 8    Period Weeks    Status New    Target Date 10/19/19      PT LONG TERM GOAL #5   Title Patient will be able to sweep floors of home without rest break and with proper body mechanics in order to return to PLOF.    Baseline IE: unable    Time 8    Period Weeks    Status New    Target Date 10/19/19                 Plan - 09/21/19 1005    Clinical Impression Statement Patient presents to clinic with excellent motivation to participate in therapy. Patient demonstrates deficits in posture, R hip ROM, R hip strength, pain, balance, and activity tolerance. Patient continues to have sognificant pain reduction in  session and responds well to gentle movement without exacerbation of numbness/burning in R anterior thigh; however carryover from session to session is not significant. Patient will benefit from continued skilled therapeutic intervention to address remaining deficits in posture, R hip ROM, R hip strength, pain, balance, and activity tolerance in order to increase function and improve overall QOL.    Personal Factors and Comorbidities Age;Behavior Pattern;Comorbidity 3+;Past/Current Experience;Fitness;Time since onset of injury/illness/exacerbation    Comorbidities DM 2, HLD, HTN, OA, acide reflux    Examination-Activity Limitations Bathing;Dressing;Transfers;Squat;Lift;Bend;Stairs;Stand;Continence    Examination-Participation Restrictions Interpersonal Relationship;Yard Work;Cleaning;Laundry;Driving;Community Activity    Stability/Clinical Decision Making Evolving/Moderate complexity    Rehab Potential Fair    PT Frequency 2x / week    PT Duration 8 weeks    PT Treatment/Interventions ADLs/Self Care Home Management;Aquatic Therapy;Moist Heat;Cryotherapy;Electrical Stimulation;Therapeutic activities;Neuromuscular re-education;Functional mobility training;Stair training;Gait training;Therapeutic exercise;Balance training;Patient/family education;Manual  techniques;Taping;Passive range of motion;Dry needling;Joint Manipulations;Spinal Manipulations    PT Next Visit Plan manual (obturator internus assessment), postural re-education    PT Home Exercise Plan R sciatic nerve glides    Consulted and Agree with Plan of Care Patient           Patient will benefit from skilled therapeutic intervention in order to improve the following deficits and impairments:  Abnormal gait, Decreased balance, Decreased endurance, Decreased mobility, Difficulty walking, Hypomobility, Obesity, Pain, Postural dysfunction, Improper body mechanics, Impaired flexibility, Decreased strength, Decreased coordination, Decreased activity tolerance, Decreased range of motion  Visit Diagnosis: Pain in right hip  Muscle weakness (generalized)  Abnormal posture     Problem List Patient Active Problem List   Diagnosis Date Noted  . S/P lumbar fusion 04/12/2019  . Degeneration of intervertebral disc of lumbar region 10/18/2014  . Lumbar canal stenosis 04/19/2014  . Bursitis, trochanteric 04/19/2014  . Morbid obesity ([place]) 11/04/2013  . Type 2 diabetes mellitus with other diabetic neurological complication ([place]) 19/62/2297  . Generalized OA 07/08/2013  . Acid reflux 07/08/2013  . Benign hypertension 07/08/2013  . HLD (hyperlipidemia) 07/08/2013   Myles Gip PT, DPT [phone]  09/21/2019, 1:17 PM  [place] [facility] [address] [place], [place], [address] Phone: [phone]   Fax:  [phone]  Name: KAMYLAH MANZO MRN: [id number] Date of Birth: September 29, 1940

## 2019-09-26 ENCOUNTER — Encounter: Payer: Self-pay | Admitting: Physical Therapy

## 2019-09-26 ENCOUNTER — Other Ambulatory Visit: Payer: Self-pay

## 2019-09-26 ENCOUNTER — Ambulatory Visit: Payer: Medicare HMO | Admitting: Physical Therapy

## 2019-09-26 DIAGNOSIS — M25551 Pain in right hip: Secondary | ICD-10-CM | POA: Diagnosis not present

## 2019-09-26 DIAGNOSIS — M6281 Muscle weakness (generalized): Secondary | ICD-10-CM

## 2019-09-26 DIAGNOSIS — R293 Abnormal posture: Secondary | ICD-10-CM

## 2019-09-26 NOTE — Therapy (Signed)
[place] [address] [place], [place], [address] Phone: [phone]   Fax:  [phone]  Physical Therapy Treatment  Patient Details  Name: Carly Bell MRN: [id number] Date of Birth: 02-16-1940 Referring Provider (PT): Rosalia Hammers   Encounter Date: 09/26/2019   PT End of Session - 09/26/19 1008    Visit Number 9    Number of Visits 16    Date for PT Re-Evaluation 10/19/19    Authorization Type IE 08/24/2019    PT Start Time 0955    PT Stop Time 1050    PT Time Calculation (min) 55 min    Activity Tolerance Patient tolerated treatment well    Behavior During Therapy [facility] for tasks assessed/performed           Past Medical History:  Diagnosis Date  . Arthritis   . Cardiac murmur Diagnosed in 2007  . Diabetes mellitus without complication ([place])   . Diverticulosis   . GERD (gastroesophageal reflux disease)   . Hyperlipidemia   . Hypertension   . Lower extremity edema   . PVD (peripheral vascular disease) ([place])     Past Surgical History:  Procedure Laterality Date  . ANTERIOR LATERAL LUMBAR FUSION WITH PERCUTANEOUS SCREW 1 LEVEL N/A 04/12/2019   Procedure: L4-5 LATERAL LUMBAR INTERBODY FUSION WITH POSTERIOR FIXATION AT L4-5;  Surgeon: Meade Maw, MD;  Location: [facility] ORS;  Service: Neurosurgery;  Laterality: N/A;  . BREAST BIOPSY Left 1990's   neg- Done in Radiology  . CHOLECYSTECTOMY  2008  . COLON SURGERY     bowel obstruction  . COLONOSCOPY W/ POLYPECTOMY  07/2008   Dr. Rochel Brome  . COLONOSCOPY WITH PROPOFOL N/A 12/25/2014   Procedure: COLONOSCOPY WITH PROPOFOL;  Surgeon: Lollie Sails, MD;  Location: [facility] ENDOSCOPY;  Service: Endoscopy;  Laterality: N/A;  . COLONOSCOPY WITH PROPOFOL N/A 10/27/2016   Procedure: COLONOSCOPY WITH PROPOFOL;  Surgeon: Lollie Sails, MD;  Location: [facility] ENDOSCOPY;  Service: Endoscopy;  Laterality: N/A;  . CYSTOSCOPY WITH STENT PLACEMENT Right 04/11/2015    Procedure: CYSTOSCOPY WITH STENT PLACEMENT;  Surgeon: Hollice Espy, MD;  Location: [facility] ORS;  Service: Urology;  Laterality: Right;  . DILATION AND CURETTAGE OF UTERUS  Unsure of Date  . EYE SURGERY Bilateral 2012   Cataract Removal- [facility]  . LAPAROSCOPIC RIGHT COLECTOMY Right 04/11/2015   Procedure: LAPAROSCOPIC RIGHT COLECTOMY, REMOVAL OF RIGHT URETERAL STENT;  Surgeon: Hubbard Robinson, MD;  Location: [facility] ORS;  Service: General;  Laterality: Right;  . TONSILLECTOMY  as child    There were no vitals filed for this visit.   Subjective Assessment - 09/26/19 1001    Subjective Patient presents to clinic with complaints of a floater in her R eye; this started on Sunday. Patient adds that her posterior R hip pain is about the same with burning/numbness radiating to the inferior portion of the R knee. Patient also states that she was able to sweep her kitchen and dining area over the weekend but had increased back after stopping. Back pain was bilateral across the lower portion.    Currently in Pain? Yes    Pain Score 6     Pain Location Back    Pain Orientation Lower;Right    Pain Descriptors / Indicators Aching;Burning;Numbness    Pain Radiating Towards R thigh, R knee           TREATMENT  Manual Therapy: STM and TPR performed to R gluteal complex, R  anterior and lateral thigh to allow for decreased tension and pain and improved posture and function using virbatory and percussive device R sacral border mobilization to allow for improved mobility and function, grade III  Neuromuscular Re-education: Seated pelvic tilts for postural awareness and improved mobility Seated TrA activation with TCs and coordinated breath for improved postural control/endurance Patient education on using movement throughout the day in small bouts to alleviate muscle spasm and improve mobility without irritating R posterior hip/lumbar region.  Patient educated throughout session on  appropriate technique and form using multi-modal cueing, HEP, and activity modification. Patient articulated understanding and returned demonstration.  Patient Response to interventions: Patient reports pain relief at end of session.  ASSESSMENT Patient presents to clinic with excellent motivation to participate in therapy. Patient demonstrates deficits in posture, R hip ROM, R hip strength, pain, balance, and activity tolerance. Patient able to achieve significant pain reduction within today's session and responded well to manual and active interventions. Patient's primary complaint (for this episode) of R groin pain remains resolved with duration of ~5 weeks of RLE burning and numbness from R posterior hip being her chief area of focus at this time. Patient will benefit from continued skilled therapeutic intervention to address remaining deficits in posture, R hip ROM, R hip strength, pain, balance, and activity tolerance in order to increase function and improve overall QOL.      PT Long Term Goals - 08/24/19 1049      PT LONG TERM GOAL #1   Title Patient will demonstrate independence with HEP in order to maximize therapeutic gains and improve carryover from physical therapy sessions to ADLs in the home and community.    Baseline IE: provided    Time 8    Period Weeks    Status New    Target Date 10/19/19      PT LONG TERM GOAL #2   Title Patient will demonstrate improved function as evidenced by a score of 52 on FOTO measure for full participation in activities at home and in the community.    Baseline IE: 31    Time 8    Period Weeks    Status New    Target Date 10/19/19      PT LONG TERM GOAL #3   Title Patient will decrease worst pain as reported on NPRS by at least 2 points to demonstrate clinically significant reduction in pain in order to restore/improve function and overall QOL.    Baseline IE: 9/10    Time 8    Period Weeks    Status New    Target Date 10/19/19       PT LONG TERM GOAL #4   Title Patient will rate standing for an hour and walking a mile as "moderate difficulty/a little bit of difficulty" in order to participate more fully in community activities.    Baseline IE: "quite a bit of difficulty"    Time 8    Period Weeks    Status New    Target Date 10/19/19      PT LONG TERM GOAL #5   Title Patient will be able to sweep floors of home without rest break and with proper body mechanics in order to return to PLOF.    Baseline IE: unable    Time 8    Period Weeks    Status New    Target Date 10/19/19                 Plan -  09/26/19 1008    Clinical Impression Statement Patient presents to clinic with excellent motivation to participate in therapy. Patient demonstrates deficits in posture, R hip ROM, R hip strength, pain, balance, and activity tolerance. Patient able to achieve significant pain reduction within today's session and responded well to manual and active interventions. Patient's primary complaint (for this episode) of R groin pain remains resolved with duration of ~5 weeks of RLE burning and numbness from R posterior hip being her chief area of focus at this time. Patient will benefit from continued skilled therapeutic intervention to address remaining deficits in posture, R hip ROM, R hip strength, pain, balance, and activity tolerance in order to increase function and improve overall QOL.    Personal Factors and Comorbidities Age;Behavior Pattern;Comorbidity 3+;Past/Current Experience;Fitness;Time since onset of injury/illness/exacerbation    Comorbidities DM 2, HLD, HTN, OA, acide reflux    Examination-Activity Limitations Bathing;Dressing;Transfers;Squat;Lift;Bend;Stairs;Stand;Continence    Examination-Participation Restrictions Interpersonal Relationship;Yard Work;Cleaning;Laundry;Driving;Community Activity    Stability/Clinical Decision Making Evolving/Moderate complexity    Rehab Potential Fair    PT Frequency 2x / week     PT Duration 8 weeks    PT Treatment/Interventions ADLs/Self Care Home Management;Aquatic Therapy;Moist Heat;Cryotherapy;Electrical Stimulation;Therapeutic activities;Neuromuscular re-education;Functional mobility training;Stair training;Gait training;Therapeutic exercise;Balance training;Patient/family education;Manual techniques;Taping;Passive range of motion;Dry needling;Joint Manipulations;Spinal Manipulations    PT Next Visit Plan --    PT Home Exercise Plan R femoral nerve glides; TrA bracing    Consulted and Agree with Plan of Care Patient           Patient will benefit from skilled therapeutic intervention in order to improve the following deficits and impairments:  Abnormal gait, Decreased balance, Decreased endurance, Decreased mobility, Difficulty walking, Hypomobility, Obesity, Pain, Postural dysfunction, Improper body mechanics, Impaired flexibility, Decreased strength, Decreased coordination, Decreased activity tolerance, Decreased range of motion  Visit Diagnosis: Pain in right hip  Muscle weakness (generalized)  Abnormal posture     Problem List Patient Active Problem List   Diagnosis Date Noted  . S/P lumbar fusion 04/12/2019  . Degeneration of intervertebral disc of lumbar region 10/18/2014  . Lumbar canal stenosis 04/19/2014  . Bursitis, trochanteric 04/19/2014  . Morbid obesity ([place]) 11/04/2013  . Type 2 diabetes mellitus with other diabetic neurological complication ([place]) 47/09/6281  . Generalized OA 07/08/2013  . Acid reflux 07/08/2013  . Benign hypertension 07/08/2013  . HLD (hyperlipidemia) 07/08/2013   Myles Gip PT, DPT [phone]  09/26/2019, 1:15 PM  [place] [facility] [address] [place], [place], [address] Phone: [phone]   Fax:  [phone]  Name: Carly Bell MRN: [id number] Date of Birth: [birth year]

## 2019-09-27 ENCOUNTER — Encounter: Payer: Medicare HMO | Admitting: Physical Therapy

## 2019-09-28 ENCOUNTER — Encounter: Payer: Medicare HMO | Admitting: Physical Therapy

## 2019-09-29 ENCOUNTER — Encounter: Payer: Medicare HMO | Admitting: Physical Therapy

## 2019-10-03 ENCOUNTER — Other Ambulatory Visit: Payer: Self-pay

## 2019-10-03 ENCOUNTER — Ambulatory Visit: Payer: Medicare HMO | Attending: Sports Medicine | Admitting: Physical Therapy

## 2019-10-03 ENCOUNTER — Encounter: Payer: Self-pay | Admitting: Physical Therapy

## 2019-10-03 DIAGNOSIS — M25551 Pain in right hip: Secondary | ICD-10-CM

## 2019-10-03 DIAGNOSIS — R293 Abnormal posture: Secondary | ICD-10-CM | POA: Insufficient documentation

## 2019-10-03 DIAGNOSIS — M6281 Muscle weakness (generalized): Secondary | ICD-10-CM | POA: Diagnosis present

## 2019-10-03 NOTE — Therapy (Signed)
[facility] [address] [place], [place], [address] Phone: [phone]   Fax:  [phone]  Physical Therapy Treatment Physical Therapy Progress Note   Dates of reporting period  08/24/2019   to   10/03/2019   Patient Details  Name: Carly Bell MRN: [id number] Date of Birth: [birth year] Referring Provider (PT): Rosalia Hammers   Encounter Date: 10/03/2019   PT End of Session - 10/03/19 1247    Visit Number 10    Number of Visits 16    Date for PT Re-Evaluation 10/19/19    Authorization Type IE 08/24/2019    PT Start Time 0950    PT Stop Time 1050    PT Time Calculation (min) 60 min    Activity Tolerance Patient tolerated treatment well    Behavior During Therapy [facility] for tasks assessed/performed           Past Medical History:  Diagnosis Date  . Arthritis   . Cardiac murmur Diagnosed in 2007  . Diabetes mellitus without complication ([place])   . Diverticulosis   . GERD (gastroesophageal reflux disease)   . Hyperlipidemia   . Hypertension   . Lower extremity edema   . PVD (peripheral vascular disease) ([place])     Past Surgical History:  Procedure Laterality Date  . ANTERIOR LATERAL LUMBAR FUSION WITH PERCUTANEOUS SCREW 1 LEVEL N/A 04/12/2019   Procedure: L4-5 LATERAL LUMBAR INTERBODY FUSION WITH POSTERIOR FIXATION AT L4-5;  Surgeon: Meade Maw, MD;  Location: [facility] ORS;  Service: Neurosurgery;  Laterality: N/A;  . BREAST BIOPSY Left 1990's   neg- Done in Radiology  . CHOLECYSTECTOMY  2008  . COLON SURGERY     bowel obstruction  . COLONOSCOPY W/ POLYPECTOMY  07/2008   Dr. Rochel Brome  . COLONOSCOPY WITH PROPOFOL N/A 12/25/2014   Procedure: COLONOSCOPY WITH PROPOFOL;  Surgeon: Lollie Sails, MD;  Location: [facility] ENDOSCOPY;  Service: Endoscopy;  Laterality: N/A;  . COLONOSCOPY WITH PROPOFOL N/A 10/27/2016   Procedure: COLONOSCOPY WITH PROPOFOL;  Surgeon: Lollie Sails, MD;  Location: [facility] ENDOSCOPY;   Service: Endoscopy;  Laterality: N/A;  . CYSTOSCOPY WITH STENT PLACEMENT Right 04/11/2015   Procedure: CYSTOSCOPY WITH STENT PLACEMENT;  Surgeon: Hollice Espy, MD;  Location: [facility] ORS;  Service: Urology;  Laterality: Right;  . DILATION AND CURETTAGE OF UTERUS  Unsure of Date  . EYE SURGERY Bilateral 2012   Cataract Removal- [facility]  . LAPAROSCOPIC RIGHT COLECTOMY Right 04/11/2015   Procedure: LAPAROSCOPIC RIGHT COLECTOMY, REMOVAL OF RIGHT URETERAL STENT;  Surgeon: Hubbard Robinson, MD;  Location: [facility] ORS;  Service: General;  Laterality: Right;  . TONSILLECTOMY  as child    There were no vitals filed for this visit.   Subjective Assessment - 10/03/19 0952    Subjective Patient notes that she followed-up with her neurosurgeon who referred her back to orthopedics for an injection in her R anterior hip. Patient notes that her groin pain has not been as concerning to her as the numbness in her leg which wraps from the posterior R hip to the anterior thigh. Patient continues to report that she gets good relief for 24-48 hours after PT, but then the numbness and posterior R hip pain returns. Patient adds that she has no groin pain today and has isolated pain in the middle of her R buttock.    Currently in Pain? Yes    Pain Score 5     Pain Location Hip  Pain Orientation Right;Posterior           TREATMENT  Manual Therapy: STM and TPR performed to R gluteal complex, R anterior and lateral thigh to allow for decreased tension and pain and improved posture and function using virbatory and percussive device R sacral border mobilization to allow for improved mobility and function, grade III  Neuromuscular Re-education: Tandem stance, B, unilateral UE support, 3x20 sec SLS, B, unilateral UE support, 3x20 sec Patient education on strategies to manage pins & needles/numbness/burning from posterior hip radiating to anterior and medial thigh including: femoral nerve glides,  hip extension, abdominal bracing. Reassessed goals; see below.   Patient educated throughout session on appropriate technique and form using multi-modal cueing, HEP, and activity modification. Patient articulated understanding and returned demonstration.  Patient Response to interventions: Patient reports 0/10 pain at end of session.  ASSESSMENT Patient presents to clinic with excellent motivation to participate in therapy. Patient demonstrates deficits in posture, R hip ROM, R hip strength, pain, balance, and activity tolerance. Patient continues to have resolution of groin pain from adductor strain as evidenced by goal achievements (see below). Pain from the posterior R hip/SIJ with radiating paresthesias (described by patient as "numbness" and "skin crawling") in an L4-5 dermatomal pattern (radiating from posterior hip to anterior and medial thigh/knee) have not been resolved as yet and continue to be the patient's primary complaint. Patient was able to achieve significant pain reduction within today's session and responded well to manual and active interventions. Patient's condition has the potential to improve further in response to therapy. Maximum improvement is yet to be obtained. Patient will benefit from continued skilled therapeutic intervention to address remaining deficits in posture, R hip ROM, R hip strength, pain, balance, and activity tolerance in order to increase function and improve overall QOL.    PT Long Term Goals - 10/03/19 1000      PT LONG TERM GOAL #1   Title Patient will demonstrate independence with HEP in order to maximize therapeutic gains and improve carryover from physical therapy sessions to ADLs in the home and community.    Baseline IE: provided; 9/7: IND    Time 8    Period Weeks    Status Achieved      PT LONG TERM GOAL #2   Title Patient will demonstrate improved function as evidenced by a score of 52 on FOTO measure for full participation in activities at  home and in the community.    Baseline IE: 31; 9/7: 60    Time 8    Period Weeks    Status Achieved    Target Date 10/19/19      PT LONG TERM GOAL #3   Title Patient will decrease worst pain as reported on NPRS by at least 2 points to demonstrate clinically significant reduction in pain in order to restore/improve function and overall QOL.    Baseline IE: 9/10; 9/7: 8/10 (posterior hip/SIJ radiating to anterior thigh and medial knee)    Time 8    Period Weeks    Status On-going    Target Date 10/19/19      PT LONG TERM GOAL #4   Title Patient will rate standing for an hour and walking a mile as "moderate difficulty/a little bit of difficulty" in order to participate more fully in community activities.    Baseline IE: "quite a bit of difficulty"; 9/7: "moderate difficulty"    Time 8    Period Weeks    Status Achieved  Target Date 10/19/19      PT LONG TERM GOAL #5   Title Patient will be able to sweep floors of home without rest break and with proper body mechanics in order to return to PLOF.    Baseline IE: unable; 9/7: able to sweep one room at a time without rest break    Time 8    Period Weeks    Status Achieved    Target Date 10/19/19                 Plan - 10/03/19 1247    Clinical Impression Statement Patient presents to clinic with excellent motivation to participate in therapy. Patient demonstrates deficits in posture, R hip ROM, R hip strength, pain, balance, and activity tolerance. Patient continues to have resolution of groin pain from adductor strain as evidenced by goal achievements (see below). Pain from the posterior R hip/SIJ with radiating paresthesias (described by patient as "numbness" and "skin crawling") in an L4-5 dermatomal pattern (radiating from posterior hip to anterior and medial thigh/knee) have not been resolved as yet and continue to be the patient's primary complaint. Patient was able to achieve significant pain reduction within today's  session and responded well to manual and active interventions. Patient's condition has the potential to improve further in response to therapy. Maximum improvement is yet to be obtained. Patient will benefit from continued skilled therapeutic intervention to address remaining deficits in posture, R hip ROM, R hip strength, pain, balance, and activity tolerance in order to increase function and improve overall QOL.    Personal Factors and Comorbidities Age;Behavior Pattern;Comorbidity 3+;Past/Current Experience;Fitness;Time since onset of injury/illness/exacerbation    Comorbidities DM 2, HLD, HTN, OA, acide reflux    Examination-Activity Limitations Bathing;Dressing;Transfers;Squat;Lift;Bend;Stairs;Stand;Continence    Examination-Participation Restrictions Interpersonal Relationship;Yard Work;Cleaning;Laundry;Driving;Community Activity    Stability/Clinical Decision Making Evolving/Moderate complexity    Rehab Potential Fair    PT Frequency 2x / week    PT Duration 8 weeks    PT Treatment/Interventions ADLs/Self Care Home Management;Aquatic Therapy;Moist Heat;Cryotherapy;Electrical Stimulation;Therapeutic activities;Neuromuscular re-education;Functional mobility training;Stair training;Gait training;Therapeutic exercise;Balance training;Patient/family education;Manual techniques;Taping;Passive range of motion;Dry needling;Joint Manipulations;Spinal Manipulations    PT Home Exercise Plan R femoral nerve glides; TrA bracing    Consulted and Agree with Plan of Care Patient           Patient will benefit from skilled therapeutic intervention in order to improve the following deficits and impairments:  Abnormal gait, Decreased balance, Decreased endurance, Decreased mobility, Difficulty walking, Hypomobility, Obesity, Pain, Postural dysfunction, Improper body mechanics, Impaired flexibility, Decreased strength, Decreased coordination, Decreased activity tolerance, Decreased range of motion  Visit  Diagnosis: Pain in right hip  Muscle weakness (generalized)  Abnormal posture     Problem List Patient Active Problem List   Diagnosis Date Noted  . S/P lumbar fusion 04/12/2019  . Degeneration of intervertebral disc of lumbar region 10/18/2014  . Lumbar canal stenosis 04/19/2014  . Bursitis, trochanteric 04/19/2014  . Morbid obesity ([place]) 11/04/2013  . Type 2 diabetes mellitus with other diabetic neurological complication ([place]) 49/67/5916  . Generalized OA 07/08/2013  . Acid reflux 07/08/2013  . Benign hypertension 07/08/2013  . HLD (hyperlipidemia) 07/08/2013   Myles Gip PT, DPT [phone]  10/03/2019, 4:48 PM  [facility] [address] [place], [place], [address] Phone: [phone]   Fax:  [phone]  Name: Carly Bell MRN: [id number] Date of Birth: 1940/02/02

## 2019-10-04 ENCOUNTER — Encounter: Payer: Medicare HMO | Admitting: Physical Therapy

## 2019-10-05 ENCOUNTER — Ambulatory Visit: Payer: Medicare HMO | Admitting: Physical Therapy

## 2019-10-05 ENCOUNTER — Other Ambulatory Visit: Payer: Self-pay

## 2019-10-05 ENCOUNTER — Encounter: Payer: Self-pay | Admitting: Physical Therapy

## 2019-10-05 DIAGNOSIS — M25551 Pain in right hip: Secondary | ICD-10-CM | POA: Diagnosis not present

## 2019-10-05 DIAGNOSIS — M6281 Muscle weakness (generalized): Secondary | ICD-10-CM

## 2019-10-05 DIAGNOSIS — R293 Abnormal posture: Secondary | ICD-10-CM

## 2019-10-05 NOTE — Therapy (Signed)
[facility] [address] [place], [place], [address] Phone: [phone]   Fax:  [phone]  Physical Therapy Treatment  Patient Details  Name: Carly Bell MRN: [id number] Date of Birth: Jun 22, 1940 Referring Provider (PT): Rosalia Hammers   Encounter Date: 10/05/2019   PT End of Session - 10/05/19 1006    Visit Number 11    Number of Visits 16    Date for PT Re-Evaluation 10/19/19    Authorization Type IE 08/24/2019    PT Start Time 1000    PT Stop Time 1055    PT Time Calculation (min) 55 min    Activity Tolerance Patient tolerated treatment well    Behavior During Therapy [facility] for tasks assessed/performed           Past Medical History:  Diagnosis Date  . Arthritis   . Cardiac murmur Diagnosed in 2007  . Diabetes mellitus without complication ([place])   . Diverticulosis   . GERD (gastroesophageal reflux disease)   . Hyperlipidemia   . Hypertension   . Lower extremity edema   . PVD (peripheral vascular disease) ([place])     Past Surgical History:  Procedure Laterality Date  . ANTERIOR LATERAL LUMBAR FUSION WITH PERCUTANEOUS SCREW 1 LEVEL N/A 04/12/2019   Procedure: L4-5 LATERAL LUMBAR INTERBODY FUSION WITH POSTERIOR FIXATION AT L4-5;  Surgeon: Meade Maw, MD;  Location: [facility] ORS;  Service: Neurosurgery;  Laterality: N/A;  . BREAST BIOPSY Left 1990's   neg- Done in Radiology  . CHOLECYSTECTOMY  2008  . COLON SURGERY     bowel obstruction  . COLONOSCOPY W/ POLYPECTOMY  07/2008   Dr. Rochel Brome  . COLONOSCOPY WITH PROPOFOL N/A 12/25/2014   Procedure: COLONOSCOPY WITH PROPOFOL;  Surgeon: Lollie Sails, MD;  Location: [facility] ENDOSCOPY;  Service: Endoscopy;  Laterality: N/A;  . COLONOSCOPY WITH PROPOFOL N/A 10/27/2016   Procedure: COLONOSCOPY WITH PROPOFOL;  Surgeon: Lollie Sails, MD;  Location: [facility] ENDOSCOPY;  Service: Endoscopy;  Laterality: N/A;  . CYSTOSCOPY WITH STENT PLACEMENT Right 04/11/2015    Procedure: CYSTOSCOPY WITH STENT PLACEMENT;  Surgeon: Hollice Espy, MD;  Location: [facility] ORS;  Service: Urology;  Laterality: Right;  . DILATION AND CURETTAGE OF UTERUS  Unsure of Date  . EYE SURGERY Bilateral 2012   [facility]act Removal- [facility]  . LAPAROSCOPIC RIGHT COLECTOMY Right 04/11/2015   Procedure: LAPAROSCOPIC RIGHT COLECTOMY, REMOVAL OF RIGHT URETERAL STENT;  Surgeon: Hubbard Robinson, MD;  Location: [facility] ORS;  Service: General;  Laterality: Right;  . TONSILLECTOMY  as child    There were no vitals filed for this visit.   Subjective Assessment - 10/05/19 1003    Subjective Patient reports that she had a busy day yesterday with a family funeral. Patient notes that she doesn't have much pain in her R posterior hip. She states that she does feel like she is able to do more than before. She notes that her R thigh numbness and crawling is still bothering her most; worse at night and if she is laying on her L side.    Currently in Pain? Yes    Pain Score 4     Pain Location Hip    Pain Orientation Right;Posterior           TREATMENT  Manual Therapy: STM and TPR performed to R gluteal complex, R anterior and lateral thigh to allow for decreased tension and pain and improved posture and function using virbatory and percussive device R  sacral border mobilization to allow for improved mobility and function, grade III Posterior innominate mobilizations R for improved mobility and function  Neuromuscular Re-education: Reviewed HEP and discussed the importance of balance exercises for improved hip strength and ease of walking.  Sidelying R femoral nerve glides with PT assistance for decreased nerve irritation Sidelying R hip flexor stretch with PT assistance for improved posture and decreased nerve irritation  Patient educated throughout session on appropriate technique and form using multi-modal cueing, HEP, and activity modification. Patient articulated  understanding and returned demonstration.  Patient Response to interventions: Patient reports 0/10 pain at end of session.  ASSESSMENT Patient presents to clinic with excellent motivation to participate in therapy. Patient demonstrates deficits in posture, R hip ROM, R hip strength, pain, balance, and activity tolerance. Patient was able to tolerate increased R innominate and sacral border mobilizations today's session and responded well to manual and active interventions. Patient will benefit from continued skilled therapeutic intervention to address remaining deficits in posture, R hip ROM, R hip strength, pain, balance, and activity tolerance in order to increase function and improve overall QOL.      PT Long Term Goals - 10/03/19 1000      PT LONG TERM GOAL #1   Title Patient will demonstrate independence with HEP in order to maximize therapeutic gains and improve carryover from physical therapy sessions to ADLs in the home and community.    Baseline IE: provided; 9/7: IND    Time 8    Period Weeks    Status Achieved      PT LONG TERM GOAL #2   Title Patient will demonstrate improved function as evidenced by a score of 52 on FOTO measure for full participation in activities at home and in the community.    Baseline IE: 31; 9/7: 60    Time 8    Period Weeks    Status Achieved    Target Date 10/19/19      PT LONG TERM GOAL #3   Title Patient will decrease worst pain as reported on NPRS by at least 2 points to demonstrate clinically significant reduction in pain in order to restore/improve function and overall QOL.    Baseline IE: 9/10; 9/7: 8/10 (posterior hip/SIJ radiating to anterior thigh and medial knee)    Time 8    Period Weeks    Status On-going    Target Date 10/19/19      PT LONG TERM GOAL #4   Title Patient will rate standing for an hour and walking a mile as "moderate difficulty/a little bit of difficulty" in order to participate more fully in community activities.     Baseline IE: "quite a bit of difficulty"; 9/7: "moderate difficulty"    Time 8    Period Weeks    Status Achieved    Target Date 10/19/19      PT LONG TERM GOAL #5   Title Patient will be able to sweep floors of home without rest break and with proper body mechanics in order to return to PLOF.    Baseline IE: unable; 9/7: able to sweep one room at a time without rest break    Time 8    Period Weeks    Status Achieved    Target Date 10/19/19                 Plan - 10/05/19 1006    Clinical Impression Statement Patient presents to clinic with excellent motivation to participate in therapy.  Patient demonstrates deficits in posture, R hip ROM, R hip strength, pain, balance, and activity tolerance. Patient was able to tolerate increased R innominate and sacral border mobilizations today's session and responded well to manual and active interventions. Patient will benefit from continued skilled therapeutic intervention to address remaining deficits in posture, R hip ROM, R hip strength, pain, balance, and activity tolerance in order to increase function and improve overall QOL.    Personal Factors and Comorbidities Age;Behavior Pattern;Comorbidity 3+;Past/Current Experience;Fitness;Time since onset of injury/illness/exacerbation    Comorbidities DM 2, HLD, HTN, OA, acide reflux    Examination-Activity Limitations Bathing;Dressing;Transfers;Squat;Lift;Bend;Stairs;Stand;Continence    Examination-Participation Restrictions Interpersonal Relationship;Yard Work;Cleaning;Laundry;Driving;Community Activity    Stability/Clinical Decision Making Evolving/Moderate complexity    Rehab Potential Fair    PT Frequency 2x / week    PT Duration 8 weeks    PT Treatment/Interventions ADLs/Self Care Home Management;Aquatic Therapy;Moist Heat;Cryotherapy;Electrical Stimulation;Therapeutic activities;Neuromuscular re-education;Functional mobility training;Stair training;Gait training;Therapeutic  exercise;Balance training;Patient/family education;Manual techniques;Taping;Passive range of motion;Dry needling;Joint Manipulations;Spinal Manipulations    PT Home Exercise Plan R femoral nerve glides; TrA bracing    Consulted and Agree with Plan of Care Patient           Patient will benefit from skilled therapeutic intervention in order to improve the following deficits and impairments:  Abnormal gait, Decreased balance, Decreased endurance, Decreased mobility, Difficulty walking, Hypomobility, Obesity, Pain, Postural dysfunction, Improper body mechanics, Impaired flexibility, Decreased strength, Decreased coordination, Decreased activity tolerance, Decreased range of motion  Visit Diagnosis: Pain in right hip  Muscle weakness (generalized)  Abnormal posture     Problem List Patient Active Problem List   Diagnosis Date Noted  . S/P lumbar fusion 04/12/2019  . Degeneration of intervertebral disc of lumbar region 10/18/2014  . Lumbar canal stenosis 04/19/2014  . Bursitis, trochanteric 04/19/2014  . Morbid obesity ([place]) 11/04/2013  . Type 2 diabetes mellitus with other diabetic neurological complication ([place]) 74/94/4967  . Generalized OA 07/08/2013  . Acid reflux 07/08/2013  . Benign hypertension 07/08/2013  . HLD (hyperlipidemia) 07/08/2013   Myles Gip PT, DPT [phone]  10/05/2019, 12:26 PM  [place] [facility] [address] [place], [place], [address] Phone: [phone]   Fax:  [phone]  Name: Carly Bell MRN: [id number] Date of Birth: 1940-12-02

## 2019-10-06 ENCOUNTER — Encounter: Payer: Medicare HMO | Admitting: Physical Therapy

## 2019-10-10 ENCOUNTER — Encounter: Payer: Medicare HMO | Admitting: Physical Therapy

## 2019-10-10 ENCOUNTER — Encounter: Payer: Self-pay | Admitting: Physical Therapy

## 2019-10-10 ENCOUNTER — Other Ambulatory Visit: Payer: Self-pay

## 2019-10-10 ENCOUNTER — Ambulatory Visit: Payer: Medicare HMO | Admitting: Physical Therapy

## 2019-10-10 DIAGNOSIS — M25551 Pain in right hip: Secondary | ICD-10-CM | POA: Diagnosis not present

## 2019-10-10 DIAGNOSIS — M6281 Muscle weakness (generalized): Secondary | ICD-10-CM

## 2019-10-10 DIAGNOSIS — R293 Abnormal posture: Secondary | ICD-10-CM

## 2019-10-10 NOTE — Therapy (Signed)
[facility] [address] [place], [place], [address] Phone: [phone]   Fax:  [phone]  Physical Therapy Treatment  Patient Details  Name: Carly Bell MRN: [id number] Date of Birth: 01/15/1941 Referring Provider (PT): Rosalia Hammers   Encounter Date: 10/10/2019   PT End of Session - 10/10/19 1005    Visit Number 12    Number of Visits 16    Date for PT Re-Evaluation 10/19/19    Authorization Type IE 08/24/2019    PT Start Time 1000    PT Stop Time 1045    PT Time Calculation (min) 45 min    Activity Tolerance Patient tolerated treatment well    Behavior During Therapy [facility] for tasks assessed/performed           Past Medical History:  Diagnosis Date  . Arthritis   . Cardiac murmur Diagnosed in 2007  . Diabetes mellitus without complication ([place])   . Diverticulosis   . GERD (gastroesophageal reflux disease)   . Hyperlipidemia   . Hypertension   . Lower extremity edema   . PVD (peripheral vascular disease) ([place])     Past Surgical History:  Procedure Laterality Date  . ANTERIOR LATERAL LUMBAR FUSION WITH PERCUTANEOUS SCREW 1 LEVEL N/A 04/12/2019   Procedure: L4-5 LATERAL LUMBAR INTERBODY FUSION WITH POSTERIOR FIXATION AT L4-5;  Surgeon: Meade Maw, MD;  Location: [facility] ORS;  Service: Neurosurgery;  Laterality: N/A;  . BREAST BIOPSY Left 1990's   neg- Done in Radiology  . CHOLECYSTECTOMY  2008  . COLON SURGERY     bowel obstruction  . COLONOSCOPY W/ POLYPECTOMY  07/2008   Dr. Rochel Brome  . COLONOSCOPY WITH PROPOFOL N/A 12/25/2014   Procedure: COLONOSCOPY WITH PROPOFOL;  Surgeon: Lollie Sails, MD;  Location: [facility] ENDOSCOPY;  Service: Endoscopy;  Laterality: N/A;  . COLONOSCOPY WITH PROPOFOL N/A 10/27/2016   Procedure: COLONOSCOPY WITH PROPOFOL;  Surgeon: Lollie Sails, MD;  Location: [facility] ENDOSCOPY;  Service: Endoscopy;  Laterality: N/A;  . CYSTOSCOPY WITH STENT PLACEMENT Right 04/11/2015    Procedure: CYSTOSCOPY WITH STENT PLACEMENT;  Surgeon: Hollice Espy, MD;  Location: [facility] ORS;  Service: Urology;  Laterality: Right;  . DILATION AND CURETTAGE OF UTERUS  Unsure of Date  . EYE SURGERY Bilateral 2012   Cataract Removal- [facility]  . LAPAROSCOPIC RIGHT COLECTOMY Right 04/11/2015   Procedure: LAPAROSCOPIC RIGHT COLECTOMY, REMOVAL OF RIGHT URETERAL STENT;  Surgeon: Hubbard Robinson, MD;  Location: [facility] ORS;  Service: General;  Laterality: Right;  . TONSILLECTOMY  as child    There were no vitals filed for this visit.   Subjective Assessment - 10/10/19 1002    Subjective Patient states that she continues to have her good says and bad days. She is sometimes able to get a lot done but then finds she needs to sit down and take a break for awhile. Patient notes that she isn't hurting too bad today at all.    Currently in Pain? Yes    Pain Score 4     Pain Location Hip    Pain Orientation Right;Posterior             TREATMENT  Manual Therapy: STM and TPR performed to R gluteal complex, R anterior and lateral thigh to allow for decreased tension and pain and improved posture and function using virbatory and percussive device R sacral border mobilization to allow for improved mobility and function, grade III Posterior innominate mobilizations R for improved  mobility and function  Neuromuscular Re-education: Sidelying R clamshell x20 for improved motor control at the hip Sidelying R hip adduction squeeze x20 for improved motor control at the hip Sidelying R femoral nerve glides with PT assistance for decreased nerve irritation Sidelying R hip flexor stretch with PT assistance for improved posture and decreased nerve irritation  Patient educated throughout session on appropriate technique and form using multi-modal cueing, HEP, and activity modification. Patient articulated understanding and returned demonstration.  Patient Response to  interventions: Patient reports 0/10 pain at end of session.  ASSESSMENT Patient presents to clinic with excellent motivation to participate in therapy. Patient demonstrates deficits in posture, R hip ROM, R hip strength, pain, balance, and activity tolerance. Patient was able to tolerate more direct hip strengthening without symptoms during today's session and responded well to manual and active interventions. Patient will benefit from continued skilled therapeutic intervention to address remaining deficits in posture, R hip ROM, R hip strength, pain, balance, and activity tolerance in order to increase function and improve overall QOL.     PT Long Term Goals - 10/03/19 1000      PT LONG TERM GOAL #1   Title Patient will demonstrate independence with HEP in order to maximize therapeutic gains and improve carryover from physical therapy sessions to ADLs in the home and community.    Baseline IE: provided; 9/7: IND    Time 8    Period Weeks    Status Achieved      PT LONG TERM GOAL #2   Title Patient will demonstrate improved function as evidenced by a score of 52 on FOTO measure for full participation in activities at home and in the community.    Baseline IE: 31; 9/7: 60    Time 8    Period Weeks    Status Achieved    Target Date 10/19/19      PT LONG TERM GOAL #3   Title Patient will decrease worst pain as reported on NPRS by at least 2 points to demonstrate clinically significant reduction in pain in order to restore/improve function and overall QOL.    Baseline IE: 9/10; 9/7: 8/10 (posterior hip/SIJ radiating to anterior thigh and medial knee)    Time 8    Period Weeks    Status On-going    Target Date 10/19/19      PT LONG TERM GOAL #4   Title Patient will rate standing for an hour and walking a mile as "moderate difficulty/a little bit of difficulty" in order to participate more fully in community activities.    Baseline IE: "quite a bit of difficulty"; 9/7: "moderate  difficulty"    Time 8    Period Weeks    Status Achieved    Target Date 10/19/19      PT LONG TERM GOAL #5   Title Patient will be able to sweep floors of home without rest break and with proper body mechanics in order to return to PLOF.    Baseline IE: unable; 9/7: able to sweep one room at a time without rest break    Time 8    Period Weeks    Status Achieved    Target Date 10/19/19                 Plan - 10/10/19 1005    Clinical Impression Statement Patient presents to clinic with excellent motivation to participate in therapy. Patient demonstrates deficits in posture, R hip ROM, R hip strength, pain, balance,  and activity tolerance. Patient was able to tolerate more direct hip strengthening without symptoms during today's session and responded well to manual and active interventions. Patient will benefit from continued skilled therapeutic intervention to address remaining deficits in posture, R hip ROM, R hip strength, pain, balance, and activity tolerance in order to increase function and improve overall QOL.    Personal Factors and Comorbidities Age;Behavior Pattern;Comorbidity 3+;Past/Current Experience;Fitness;Time since onset of injury/illness/exacerbation    Comorbidities DM 2, HLD, HTN, OA, acide reflux    Examination-Activity Limitations Bathing;Dressing;Transfers;Squat;Lift;Bend;Stairs;Stand;Continence    Examination-Participation Restrictions Interpersonal Relationship;Yard Work;Cleaning;Laundry;Driving;Community Activity    Stability/Clinical Decision Making Evolving/Moderate complexity    Rehab Potential Fair    PT Frequency 2x / week    PT Duration 8 weeks    PT Treatment/Interventions ADLs/Self Care Home Management;Aquatic Therapy;Moist Heat;Cryotherapy;Electrical Stimulation;Therapeutic activities;Neuromuscular re-education;Functional mobility training;Stair training;Gait training;Therapeutic exercise;Balance training;Patient/family education;Manual  techniques;Taping;Passive range of motion;Dry needling;Joint Manipulations;Spinal Manipulations    PT Home Exercise Plan R femoral nerve glides; TrA bracing    Consulted and Agree with Plan of Care Patient           Patient will benefit from skilled therapeutic intervention in order to improve the following deficits and impairments:  Abnormal gait, Decreased balance, Decreased endurance, Decreased mobility, Difficulty walking, Hypomobility, Obesity, Pain, Postural dysfunction, Improper body mechanics, Impaired flexibility, Decreased strength, Decreased coordination, Decreased activity tolerance, Decreased range of motion  Visit Diagnosis: Pain in right hip  Muscle weakness (generalized)  Abnormal posture     Problem List Patient Active Problem List   Diagnosis Date Noted  . S/P lumbar fusion 04/12/2019  . Degeneration of intervertebral disc of lumbar region 10/18/2014  . Lumbar canal stenosis 04/19/2014  . Bursitis, trochanteric 04/19/2014  . Morbid obesity ([place]) 11/04/2013  . Type 2 diabetes mellitus with other diabetic neurological complication ([place]) 83/15/1761  . Generalized OA 07/08/2013  . Acid reflux 07/08/2013  . Benign hypertension 07/08/2013  . HLD (hyperlipidemia) 07/08/2013   Myles Gip PT, DPT [phone]  10/10/2019, 1:05 PM   [facility] [address] [place], [place], [address] Phone: [phone]   Fax:  [phone]  Name: AALLIYAH KILKER MRN: [id number] Date of Birth: 19-Sep-1940

## 2019-10-12 ENCOUNTER — Ambulatory Visit: Payer: Medicare HMO | Admitting: Physical Therapy

## 2019-10-12 ENCOUNTER — Other Ambulatory Visit: Payer: Self-pay

## 2019-10-12 ENCOUNTER — Encounter: Payer: Self-pay | Admitting: Physical Therapy

## 2019-10-12 DIAGNOSIS — M25551 Pain in right hip: Secondary | ICD-10-CM

## 2019-10-12 DIAGNOSIS — M6281 Muscle weakness (generalized): Secondary | ICD-10-CM

## 2019-10-12 DIAGNOSIS — R293 Abnormal posture: Secondary | ICD-10-CM

## 2019-10-12 NOTE — Therapy (Signed)
[facility] [address] [place], [place], [address] Phone: [phone]   Fax:  [phone]  Physical Therapy Treatment/Discharge  Patient Details  Name: Carly Bell MRN: [id number] Date of Birth: [birth year] Referring Provider (PT): Rosalia Hammers   Encounter Date: 10/12/2019   PT End of Session - 10/12/19 1001    Visit Number 13    Number of Visits 16    Date for PT Re-Evaluation 10/19/19    Authorization Type IE 08/24/2019    PT Start Time 0950    PT Stop Time 1045    PT Time Calculation (min) 55 min    Activity Tolerance Patient tolerated treatment well    Behavior During Therapy [facility] for tasks assessed/performed           Past Medical History:  Diagnosis Date  . Arthritis   . Cardiac murmur Diagnosed in 2007  . Diabetes mellitus without complication ([place])   . Diverticulosis   . GERD (gastroesophageal reflux disease)   . Hyperlipidemia   . Hypertension   . Lower extremity edema   . PVD (peripheral vascular disease) ([place])     Past Surgical History:  Procedure Laterality Date  . ANTERIOR LATERAL LUMBAR FUSION WITH PERCUTANEOUS SCREW 1 LEVEL N/A 04/12/2019   Procedure: L4-5 LATERAL LUMBAR INTERBODY FUSION WITH POSTERIOR FIXATION AT L4-5;  Surgeon: Meade Maw, MD;  Location: [facility] ORS;  Service: Neurosurgery;  Laterality: N/A;  . BREAST BIOPSY Left 1990's   neg- Done in Radiology  . CHOLECYSTECTOMY  2008  . COLON SURGERY     bowel obstruction  . COLONOSCOPY W/ POLYPECTOMY  07/2008   Dr. Rochel Brome  . COLONOSCOPY WITH PROPOFOL N/A 12/25/2014   Procedure: COLONOSCOPY WITH PROPOFOL;  Surgeon: Lollie Sails, MD;  Location: [facility] ENDOSCOPY;  Service: Endoscopy;  Laterality: N/A;  . COLONOSCOPY WITH PROPOFOL N/A 10/27/2016   Procedure: COLONOSCOPY WITH PROPOFOL;  Surgeon: Lollie Sails, MD;  Location: [facility] ENDOSCOPY;  Service: Endoscopy;  Laterality: N/A;  . CYSTOSCOPY WITH STENT PLACEMENT Right  04/11/2015   Procedure: CYSTOSCOPY WITH STENT PLACEMENT;  Surgeon: Hollice Espy, MD;  Location: [facility] ORS;  Service: Urology;  Laterality: Right;  . DILATION AND CURETTAGE OF UTERUS  Unsure of Date  . EYE SURGERY Bilateral 2012   [facility]act Removal- [facility]  . LAPAROSCOPIC RIGHT COLECTOMY Right 04/11/2015   Procedure: LAPAROSCOPIC RIGHT COLECTOMY, REMOVAL OF RIGHT URETERAL STENT;  Surgeon: Hubbard Robinson, MD;  Location: [facility] ORS;  Service: General;  Laterality: Right;  . TONSILLECTOMY  as child    There were no vitals filed for this visit.   Subjective Assessment - 10/12/19 0957    Subjective Patient reports that she has scheduled follow up with MD for an injection in her posterior hip to further address her radicular-like symptoms. Patient notes she feels comfortable to discharge and continue to work on her HEP for maintenance.    Currently in Pain? Yes    Pain Score 4     Pain Location Hip    Pain Orientation Right;Posterior          TREATMENT  Manual Therapy: STM and TPR performed to R gluteal complex, R anterior and lateral thigh to allow for decreased tension and pain and improved posture and function using virbatory and percussive device R sacral border mobilization to allow for improved mobility and function, grade III Posterior innominate mobilizations R for improved mobility and function  Neuromuscular Re-education: Sidelying R clamshell x20 for  improved motor control at the hip Sidelying R hip adduction squeeze x20 for improved motor control at the hip Sidelying R femoral nerve glides with PT assistance for decreased nerve irritation Sidelying R hip flexor stretch with PT assistance for improved posture and decreased nerve irritation Reviewed goals and HEP maintenance program.  Patient educated throughout session on appropriate technique and form using multi-modal cueing, HEP, and activity modification. Patient articulated understanding and returned  demonstration.  Patient Response to interventions: Patient reports 0/10 pain at end of session and moderate confidence with self-management.  ASSESSMENT Patient presents to clinic with excellent motivation to participate in therapy and notes moderate confidence in ability to self-manage. Patient has achieved all goals set forth at the start of therapy and has surpassed the predicted FOTO outcome by 15 points. Patient continues to be able to tolerate more activity at home, and while pain is still present (burning), this seems to be a symptom of fatigue which will likely subside as she continues with her home maintenance exercises. Should the patient's paresthesias continue or progress after injection, patient may benefit from continued skilled therapeutic intervention to address remaining deficits in RLE function in order to increase function and improve overall QOL. However, at this time, patient is appropriate to discharge to self-management.      PT Long Term Goals - 10/12/19 1002      PT LONG TERM GOAL #1   Title Patient will demonstrate independence with HEP in order to maximize therapeutic gains and improve carryover from physical therapy sessions to ADLs in the home and community.    Baseline IE: provided; 9/7: IND    Time 8    Period Weeks    Status Achieved      PT LONG TERM GOAL #2   Title Patient will demonstrate improved function as evidenced by a score of 52 on FOTO measure for full participation in activities at home and in the community.    Baseline IE: 31; 9/7: 60; 9/16: 67    Time 8    Period Weeks    Status Achieved      PT LONG TERM GOAL #3   Title Patient will decrease worst pain as reported on NPRS by at least 2 points to demonstrate clinically significant reduction in pain in order to restore/improve function and overall QOL.    Baseline IE: 9/10; 9/7: 8/10 (posterior hip/SIJ radiating to anterior thigh and medial knee); 9/16: 6/10 (burning sensation, patient does  not classify as "pain") and subsides in < 10 min of rest    Time 8    Period Weeks    Status Achieved      PT LONG TERM GOAL #4   Title Patient will rate standing for an hour and walking a mile as "moderate difficulty/a little bit of difficulty" in order to participate more fully in community activities.    Baseline IE: "quite a bit of difficulty"; 9/7: "moderate difficulty"    Time 8    Period Weeks    Status Achieved      PT LONG TERM GOAL #5   Title Patient will be able to sweep floors of home without rest break and with proper body mechanics in order to return to PLOF.    Baseline IE: unable; 9/7: able to sweep one room at a time without rest break    Time 8    Period Weeks    Status Achieved  Plan - 10/12/19 1001    Clinical Impression Statement Patient presents to clinic with excellent motivation to participate in therapy and notes moderate confidence in ability to self-manage. Patient has achieved all goals set forth at the start of therapy and has surpassed the predicted FOTO outcome by 15 points. Patient continues to be able to tolerate more activity at home, and while pain is still present (burning), this seems to be a symptom of fatigue which will likely subside as she continues with her home maintenance exercises. Should the patient's paresthesias continue or progress after injection, patient may benefit from continued skilled therapeutic intervention to address remaining deficits in RLE function in order to increase function and improve overall QOL. However, at this time, patient is appropriate to discharge to self-management.    Personal Factors and Comorbidities Age;Behavior Pattern;Comorbidity 3+;Past/Current Experience;Fitness;Time since onset of injury/illness/exacerbation    Comorbidities DM 2, HLD, HTN, OA, acide reflux    Examination-Activity Limitations Bathing;Dressing;Transfers;Squat;Lift;Bend;Stairs;Stand;Continence    Examination-Participation  Restrictions Interpersonal Relationship;Yard Work;Cleaning;Laundry;Driving;Community Activity    Stability/Clinical Decision Making Evolving/Moderate complexity    Rehab Potential Fair    PT Frequency 2x / week    PT Duration 8 weeks    PT Treatment/Interventions ADLs/Self Care Home Management;Aquatic Therapy;Moist Heat;Cryotherapy;Electrical Stimulation;Therapeutic activities;Neuromuscular re-education;Functional mobility training;Stair training;Gait training;Therapeutic exercise;Balance training;Patient/family education;Manual techniques;Taping;Passive range of motion;Dry needling;Joint Manipulations;Spinal Manipulations    PT Home Exercise Plan R femoral nerve glides; TrA bracing; standing balance; seated pelvic motions    Consulted and Agree with Plan of Care Patient           Patient will benefit from skilled therapeutic intervention in order to improve the following deficits and impairments:  Abnormal gait, Decreased balance, Decreased endurance, Decreased mobility, Difficulty walking, Hypomobility, Obesity, Pain, Postural dysfunction, Improper body mechanics, Impaired flexibility, Decreased strength, Decreased coordination, Decreased activity tolerance, Decreased range of motion  Visit Diagnosis: Pain in right hip  Muscle weakness (generalized)  Abnormal posture     Problem List Patient Active Problem List   Diagnosis Date Noted  . S/P lumbar fusion 04/12/2019  . Degeneration of intervertebral disc of lumbar region 10/18/2014  . Lumbar canal stenosis 04/19/2014  . Bursitis, trochanteric 04/19/2014  . Morbid obesity ([place]) 11/04/2013  . Type 2 diabetes mellitus with other diabetic neurological complication ([place]) 78/29/5621  . Generalized OA 07/08/2013  . Acid reflux 07/08/2013  . Benign hypertension 07/08/2013  . HLD (hyperlipidemia) 07/08/2013   Myles Gip PT, DPT [phone]  10/12/2019, 11:00 AM  [facility] [address] [place], [place], [address] Phone: [phone]   Fax:  [phone]  Name: Carly Bell MRN: [id number] Date of Birth: 25-Aug-1940

## 2019-10-13 ENCOUNTER — Encounter: Payer: Medicare HMO | Admitting: Physical Therapy

## 2019-10-16 ENCOUNTER — Encounter: Payer: Medicare HMO | Admitting: Physical Therapy

## 2019-10-19 ENCOUNTER — Encounter: Payer: Medicare HMO | Admitting: Physical Therapy

## 2019-10-23 ENCOUNTER — Encounter: Payer: Medicare HMO | Admitting: Physical Therapy

## 2019-10-26 ENCOUNTER — Encounter: Payer: Medicare HMO | Admitting: Physical Therapy

## 2019-11-20 DIAGNOSIS — M1611 Unilateral primary osteoarthritis, right hip: Secondary | ICD-10-CM | POA: Diagnosis not present

## 2019-11-20 DIAGNOSIS — S76211D Strain of adductor muscle, fascia and tendon of right thigh, subsequent encounter: Secondary | ICD-10-CM | POA: Diagnosis not present

## 2019-11-20 DIAGNOSIS — M25551 Pain in right hip: Secondary | ICD-10-CM | POA: Diagnosis not present

## 2019-11-20 DIAGNOSIS — M7071 Other bursitis of hip, right hip: Secondary | ICD-10-CM | POA: Diagnosis not present

## 2019-12-06 DIAGNOSIS — M1611 Unilateral primary osteoarthritis, right hip: Secondary | ICD-10-CM | POA: Diagnosis not present

## 2020-04-08 ENCOUNTER — Other Ambulatory Visit: Payer: Self-pay | Admitting: Internal Medicine

## 2020-04-08 DIAGNOSIS — Z1231 Encounter for screening mammogram for malignant neoplasm of breast: Secondary | ICD-10-CM

## 2020-04-30 ENCOUNTER — Ambulatory Visit
Admission: RE | Admit: 2020-04-30 | Discharge: 2020-04-30 | Disposition: A | Discharge status: Home / Self Care | Payer: Medicare HMO | Source: Ambulatory Visit | Attending: Internal Medicine | Admitting: Internal Medicine

## 2020-04-30 ENCOUNTER — Other Ambulatory Visit: Payer: Self-pay

## 2020-04-30 DIAGNOSIS — Z1231 Encounter for screening mammogram for malignant neoplasm of breast: Secondary | ICD-10-CM | POA: Insufficient documentation

## 2021-04-07 ENCOUNTER — Other Ambulatory Visit: Payer: Self-pay | Admitting: Internal Medicine

## 2021-04-07 DIAGNOSIS — Z1231 Encounter for screening mammogram for malignant neoplasm of breast: Secondary | ICD-10-CM

## 2021-05-08 ENCOUNTER — Ambulatory Visit
Admission: RE | Admit: 2021-05-08 | Discharge: 2021-05-08 | Disposition: A | Discharge status: Home / Self Care | Payer: Medicare HMO | Source: Ambulatory Visit | Attending: Internal Medicine | Admitting: Internal Medicine

## 2021-05-08 DIAGNOSIS — Z1231 Encounter for screening mammogram for malignant neoplasm of breast: Secondary | ICD-10-CM | POA: Insufficient documentation

## 2022-01-14 IMAGING — MG DIGITAL SCREENING BILAT W/ TOMO W/ CAD
6 of 12 series · 6 of 36 positions shown · non-contrast
Comparison: Previous exam(s).

CLINICAL DATA: Screening.

EXAM:
DIGITAL SCREENING BILATERAL MAMMOGRAM WITH TOMO AND CAD

[R CC synth-2D]
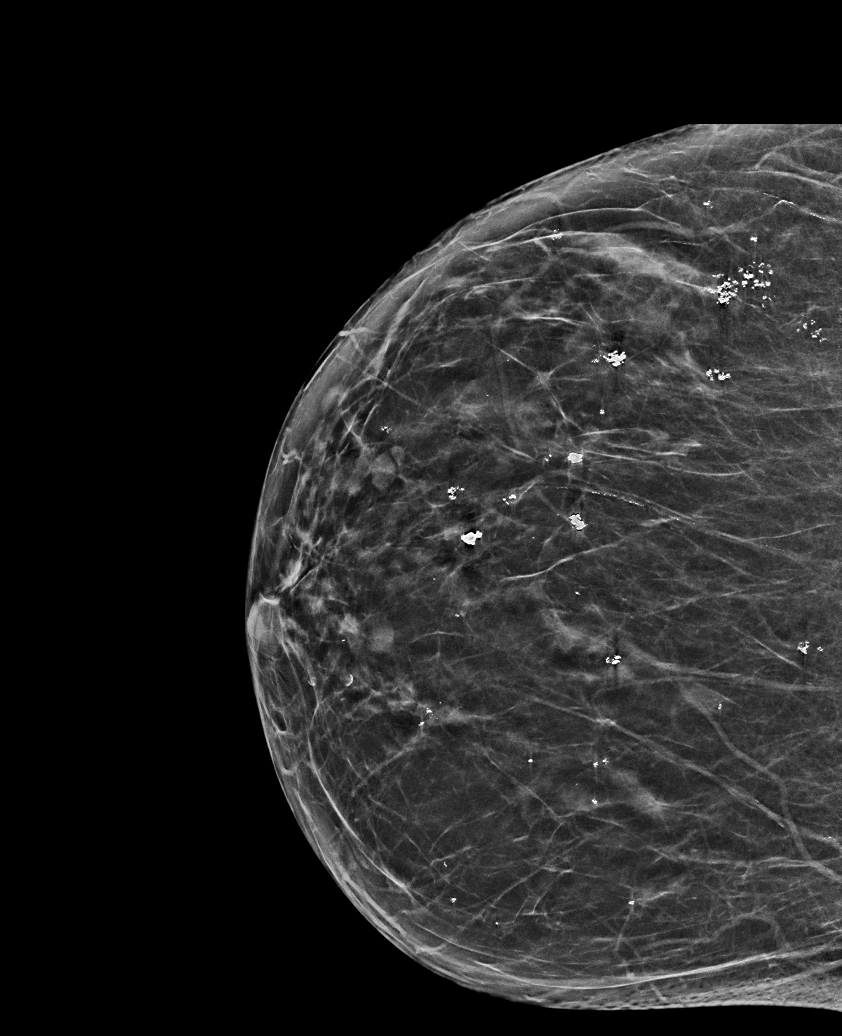

[R MLO synth-2D (1 of 2)]
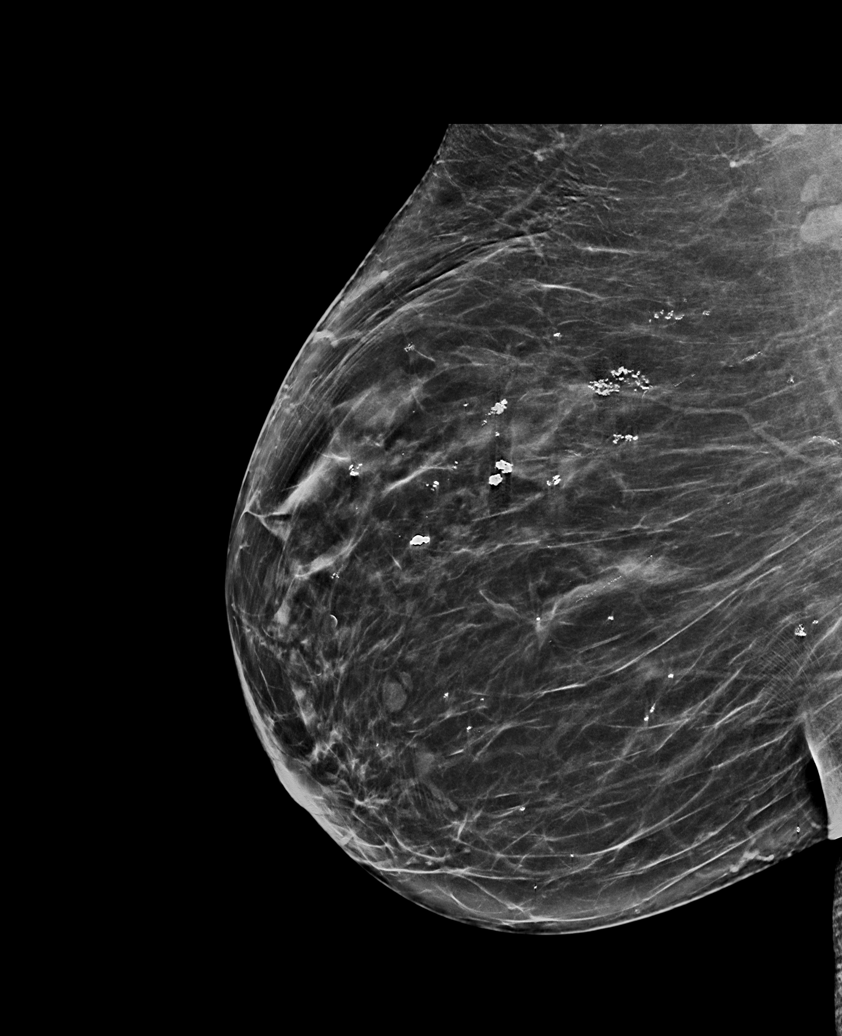

[R MLO synth-2D (2 of 2)]
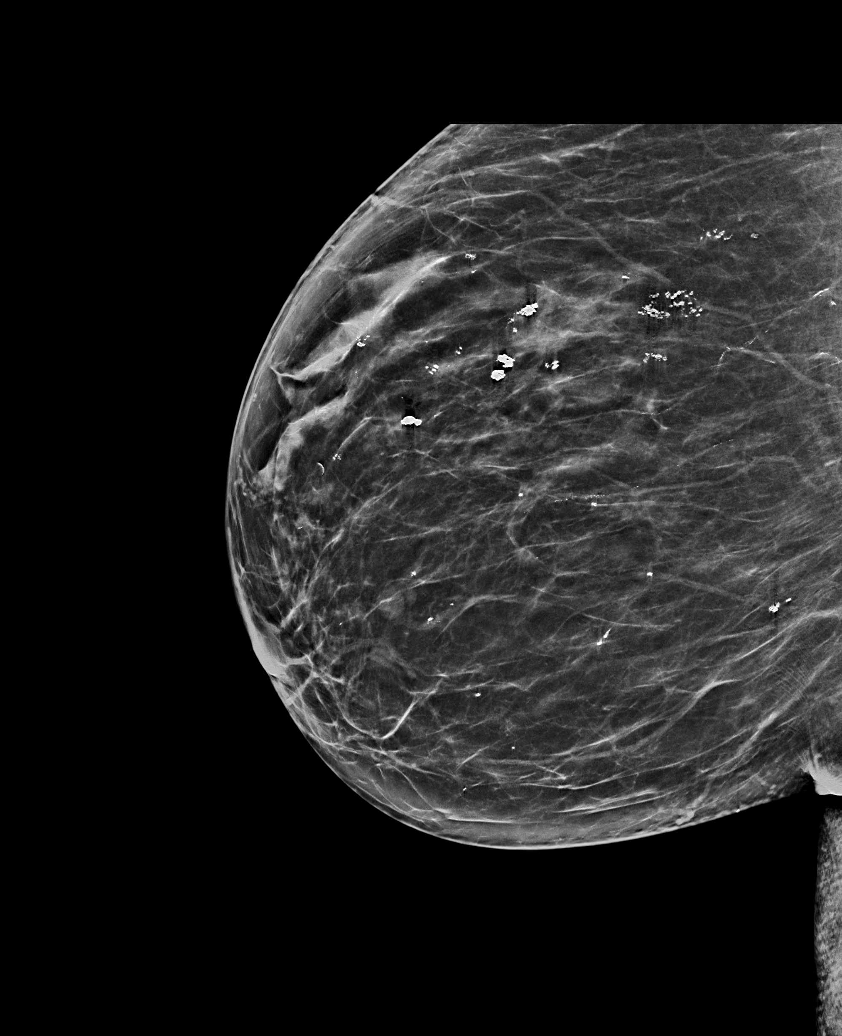

[L CC synth-2D]
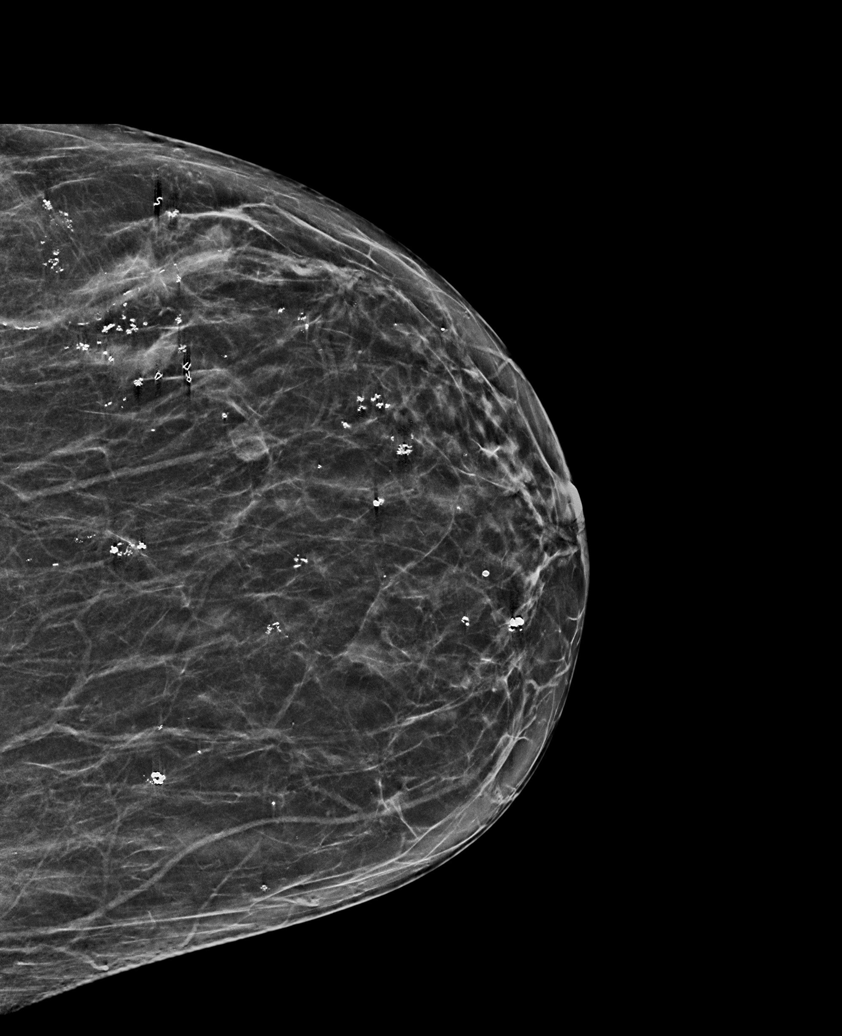

[L CV synth-2D]
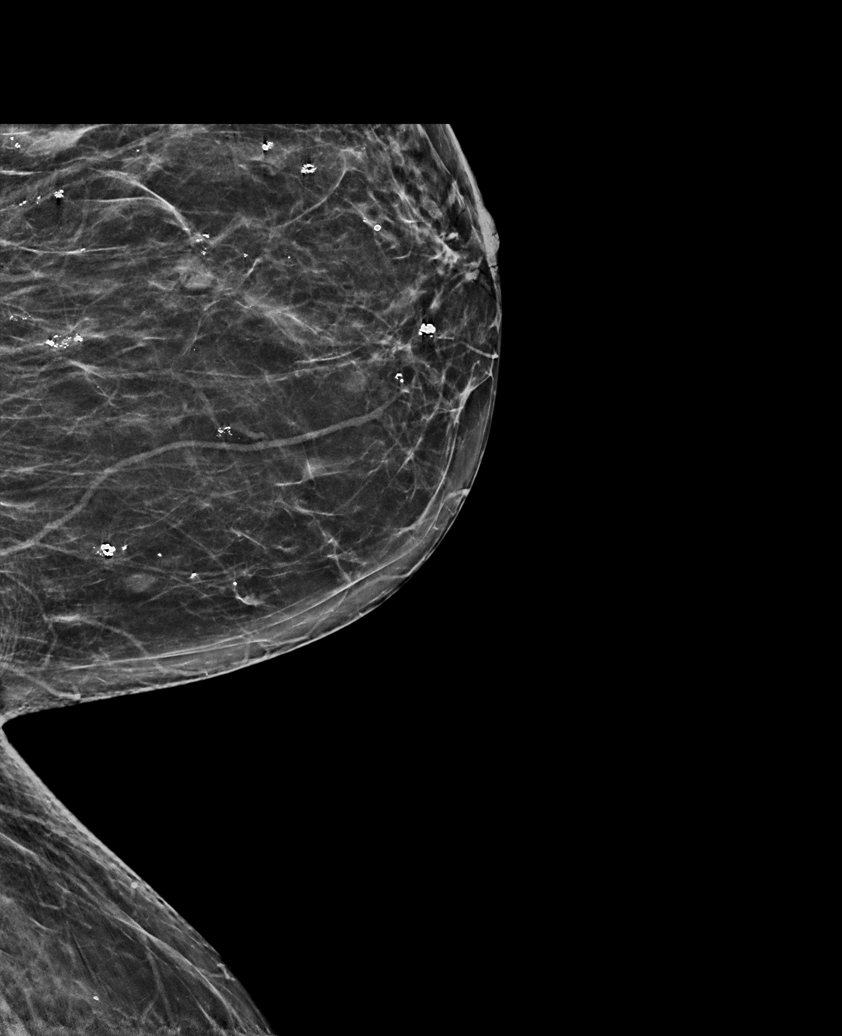

[L MLO synth-2D]
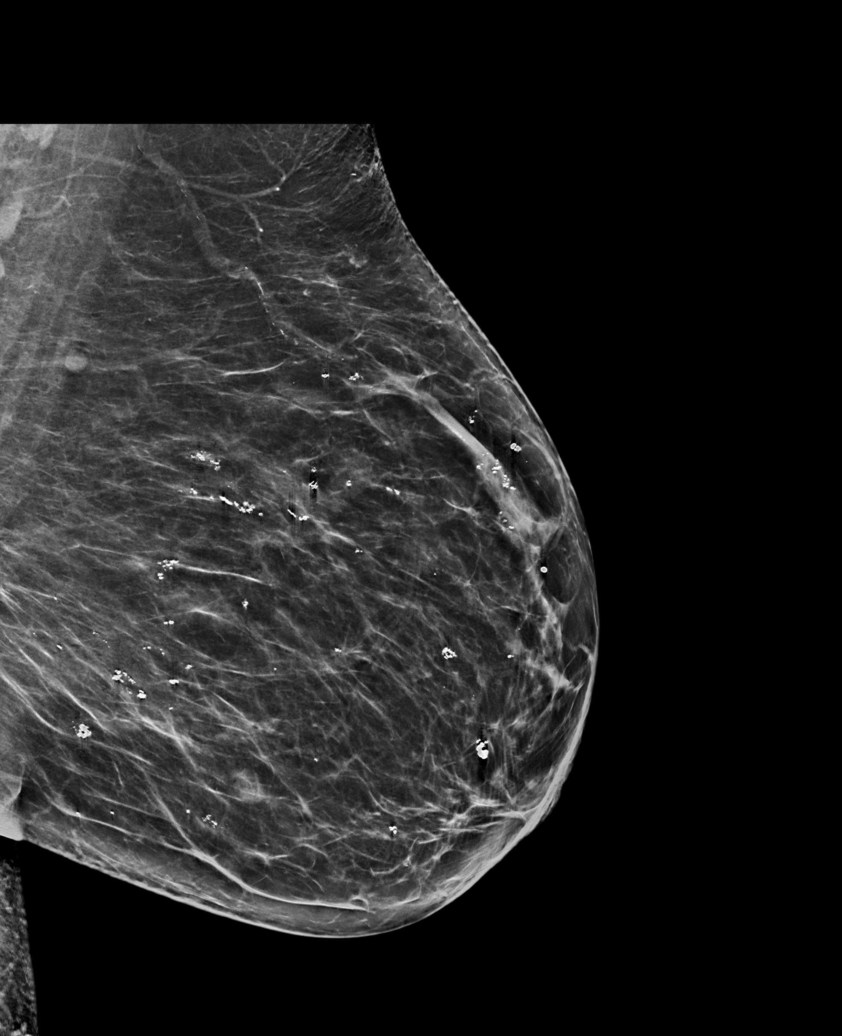

[6 of 36 positions shown; findings below may reference images not displayed]

ACR Breast Density Category b: There are scattered areas of
fibroglandular density.
FINDINGS: There are no findings suspicious for malignancy. Images were
processed with CAD.
IMPRESSION: No mammographic evidence of malignancy. A result letter of this
screening mammogram will be mailed directly to the patient.

RECOMMENDATION:
Screening mammogram in one year. (Code:[id number])

BI-RADS CATEGORY  1: Negative.

## 2022-04-14 ENCOUNTER — Other Ambulatory Visit: Payer: Self-pay | Admitting: Internal Medicine

## 2022-04-14 DIAGNOSIS — Z1231 Encounter for screening mammogram for malignant neoplasm of breast: Secondary | ICD-10-CM

## 2022-05-11 ENCOUNTER — Ambulatory Visit
Admission: RE | Admit: 2022-05-11 | Discharge: 2022-05-11 | Disposition: A | Discharge status: Home / Self Care | Payer: Medicare HMO | Source: Ambulatory Visit | Attending: Internal Medicine | Admitting: Internal Medicine

## 2022-05-11 DIAGNOSIS — Z1231 Encounter for screening mammogram for malignant neoplasm of breast: Secondary | ICD-10-CM | POA: Diagnosis present

## 2022-09-23 ENCOUNTER — Other Ambulatory Visit: Payer: Self-pay | Admitting: Physician Assistant

## 2022-09-23 DIAGNOSIS — R413 Other amnesia: Secondary | ICD-10-CM

## 2023-01-07 ENCOUNTER — Encounter: Payer: Self-pay | Admitting: Physician Assistant

## 2023-01-08 ENCOUNTER — Emergency Department: Payer: Medicare HMO

## 2023-01-08 ENCOUNTER — Emergency Department
Admission: EM | Admit: 2023-01-08 | Discharge: 2023-01-08 | Disposition: A | Discharge status: Home / Self Care | Payer: Medicare HMO | Attending: Emergency Medicine | Admitting: Emergency Medicine

## 2023-01-08 ENCOUNTER — Other Ambulatory Visit: Payer: Self-pay

## 2023-01-08 DIAGNOSIS — S0191XA Laceration without foreign body of unspecified part of head, initial encounter: Secondary | ICD-10-CM

## 2023-01-08 DIAGNOSIS — W010XXA Fall on same level from slipping, tripping and stumbling without subsequent striking against object, initial encounter: Secondary | ICD-10-CM | POA: Insufficient documentation

## 2023-01-08 DIAGNOSIS — S0101XA Laceration without foreign body of scalp, initial encounter: Secondary | ICD-10-CM | POA: Insufficient documentation

## 2023-01-08 MED ORDER — LIDOCAINE HCL (PF) 1 % IJ SOLN
5.0000 mL | Freq: Once | INTRAMUSCULAR | Status: AC
  Administered 2023-01-08: 5 mL
  Filled 2023-01-08: qty 5

## 2023-01-08 NOTE — ED Triage Notes (Signed)
Pt comes with c/o trip and fall. Pt states she fell and did not pass out. Pt not on thinners. Pt has laceration noted to left sided of head. Bleeding controlled.

## 2023-01-08 NOTE — Discharge Instructions (Signed)
Please remove the staples in 7 days.  Take Tylenol for headache.

## 2023-01-08 NOTE — ED Provider Notes (Signed)
[facility] Provider Note    Event Date/Time   First MD Initiated Contact with Patient 01/08/23 1651     (approximate)   History   Laceration and Fall   HPI  Carly Bell is a 82 y.o. female dents today with a laceration on on the scalp after she fell.  Patient did not lose consciousness, taking baby aspirin.       Physical Exam   Triage Vital Signs: ED Triage Vitals  Encounter Vitals Group     BP 01/08/23 1351 (!) 116/49     Systolic BP Percentile --      Diastolic BP Percentile --      Pulse Rate 01/08/23 1353 86     Resp 01/08/23 1351 19     Temp 01/08/23 1351 (!) 86 F (30 C)     Temp src --      SpO2 01/08/23 1351 95 %     Weight 01/08/23 1352 230 lb (104.3 kg)     Height 01/08/23 1352 5\' 6"  (1.676 m)     Head Circumference --      Peak Flow --      Pain Score 01/08/23 1352 1     Pain Loc --      Pain Education --      Exclude from Growth Chart --     Most recent vital signs: Vitals:   01/08/23 1352 01/08/23 1353  BP:    Pulse:  86  Resp:    Temp: 98 F (36.7 C)   SpO2:       General: Awake, no distress.  Head:               Left parietal scalp: Laceration of 4 cm long.  No active bleeding CV:  Good peripheral perfusion.  Resp:  Normal effort.  Abd:  No distention. Soft and no tender Other:  No swelling in legs.   ED Results / Procedures / Treatments   Labs (all labs ordered are listed, but only abnormal results are displayed) Labs Reviewed - No data to display   EKG     RADIOLOGY I independently reviewed and interpreted imaging and agree with radiologists findings.      PROCEDURES:  Critical Care performed:   .Laceration Repair  Date/Time: 01/08/2023 7:01 PM  Performed by: Gladys Damme, [name] Authorized by: Gladys Damme, [name]   Consent:    Consent obtained:  Verbal   Consent given by:  Patient   Risks, benefits, and alternatives were discussed: yes     Risks discussed:   Pain Universal protocol:    Procedure explained and questions answered to patient or proxy's satisfaction: yes     Patient identity confirmed:  Verbally with patient Anesthesia:    Anesthesia method:  Local infiltration Laceration details:    Location:  Scalp   Scalp location:  L parietal   Length (cm):  4   Depth (mm):  2 Treatment:    Area cleansed with:  Povidone-iodine   Amount of cleaning:  Standard   Irrigation solution:  Sterile saline   Visualized foreign bodies/material removed: no     Debridement:  None   Undermining:  None Skin repair:    Repair method:  Staples   Number of staples:  4 Approximation:    Approximation:  Close Repair type:    Repair type:  Simple Post-procedure details:    Dressing:  Open (no dressing)   Procedure completion:  Tolerated  MEDICATIONS ORDERED IN ED: Medications  lidocaine (PF) (XYLOCAINE) 1 % injection 5 mL (has no administration in time range)     IMPRESSION / MDM / ASSESSMENT AND PLAN / ED COURSE  I reviewed the triage vital signs and the nursing notes.  Differential diagnosis includes, but is not limited to, laceration, epidural hematoma, cervical fracture  Patient's presentation is most consistent with acute complicated illness / injury requiring diagnostic workup.   Patient's diagnosis is consistent with left parietal scalp laceration. I independently reviewed and interpreted imaging and agree with radiologists findings.  I did review the patient's allergies and medications.  Patient got staples  to approximate the scalp  up with previous anesthesia. Patient will be discharged home without prescriptions.  Patient is to follow up with PCP as needed or otherwise directed. Patient is given ED precautions to return to the ED for any worsening or new symptoms.Patient needs to see PCP for staples removal. Discussed plan of care with patient, answered all of patient's questions, Patient agreeable to plan of care. Patient  verbalized understanding. Clinical Course as of 01/08/23 1905  Caleen Essex Jan 08, 2023  1841 CT Head Wo Contrast No acute intracranial abnormality, no cervical spine fracture [AE]    Clinical Course User Index [AE] Gladys Damme, [name]     FINAL CLINICAL IMPRESSION(S) / ED DIAGNOSES   Final diagnoses:  Laceration of head without complication, initial encounter     Rx / DC Orders   ED Discharge Orders     None        Note:  This document was prepared using Dragon voice recognition software and may include unintentional dictation errors.   Gladys Damme, [name] 01/08/23 1906    Dionne Bucy, MD 01/09/23 [phone]

## 2023-01-08 NOTE — ED Provider Triage Note (Signed)
Emergency Medicine Provider Triage Evaluation Note  Carly Bell , a 82 y.o. female  was evaluated in triage.  Pt complains of fall and headstrike. Stepped down and lost her footing. No LOC. No vomiting. No anticoagulation. Mild headache  Review of Systems  Positive: Head laceration Negative: Vomiting LOC  Physical Exam  There were no vitals taken for this visit. Gen:   Awake, no distress   Resp:  Normal effort  MSK:   Moves extremities without difficulty  Other:  +scalp laceration and dried blood  Medical Decision Making  Medically screening exam initiated at 1:50 PM.  Appropriate orders placed.  Adah Perl was informed that the remainder of the evaluation will be completed by another provider, this initial triage assessment does not replace that evaluation, and the importance of remaining in the ED until their evaluation is complete.     Jackelyn Hoehn, [name] 01/08/23 1352

## 2023-01-16 ENCOUNTER — Other Ambulatory Visit: Payer: Medicare HMO

## 2023-01-29 ENCOUNTER — Telehealth: Payer: Self-pay | Admitting: *Deleted

## 2023-01-29 NOTE — Telephone Encounter (Signed)
 I s/w brittany at Dr. Genevia office and informed her that we doe not follow this pt. Looks like pt is being followed by [facility]. Brittany said the pt told them she does not see a cardiologist. I informed Brittany that the pt just saw Dr. Florencio 07/2022.   Brittany states she will reach out to [facility] and confirm if the pt is still following their practice, if not then she will reach back out to our practice as new pt referral.

## 2023-02-14 ENCOUNTER — Other Ambulatory Visit: Payer: Medicare HMO

## 2023-06-25 ENCOUNTER — Other Ambulatory Visit: Payer: Self-pay | Admitting: Internal Medicine

## 2023-06-25 DIAGNOSIS — Z1231 Encounter for screening mammogram for malignant neoplasm of breast: Secondary | ICD-10-CM

## 2023-07-09 ENCOUNTER — Ambulatory Visit
Admission: RE | Admit: 2023-07-09 | Discharge: 2023-07-09 | Disposition: A | Discharge status: Home / Self Care | Source: Ambulatory Visit | Attending: Internal Medicine | Admitting: Internal Medicine

## 2023-07-09 DIAGNOSIS — Z1231 Encounter for screening mammogram for malignant neoplasm of breast: Secondary | ICD-10-CM | POA: Diagnosis present
